# Patient Record
Sex: Female | Born: 1935 | Race: White | Hispanic: No | Smoking: Never smoker
Health system: Southern US, Community
[De-identification: ages and names within clinical notes are randomized; demographics above are authoritative.]

## PROBLEM LIST (undated history)

## (undated) DIAGNOSIS — E559 Vitamin D deficiency, unspecified: Secondary | ICD-10-CM

## (undated) DIAGNOSIS — K449 Diaphragmatic hernia without obstruction or gangrene: Secondary | ICD-10-CM

## (undated) DIAGNOSIS — Z86711 Personal history of pulmonary embolism: Secondary | ICD-10-CM

## (undated) DIAGNOSIS — R269 Unspecified abnormalities of gait and mobility: Secondary | ICD-10-CM

## (undated) DIAGNOSIS — F0280 Dementia in other diseases classified elsewhere without behavioral disturbance: Secondary | ICD-10-CM

## (undated) DIAGNOSIS — F0390 Unspecified dementia without behavioral disturbance: Secondary | ICD-10-CM

## (undated) DIAGNOSIS — G309 Alzheimer's disease, unspecified: Secondary | ICD-10-CM

## (undated) DIAGNOSIS — E785 Hyperlipidemia, unspecified: Secondary | ICD-10-CM

## (undated) DIAGNOSIS — S82892A Other fracture of left lower leg, initial encounter for closed fracture: Secondary | ICD-10-CM

## (undated) HISTORY — PX: HIATAL HERNIA REPAIR: SHX195

## (undated) HISTORY — DX: Personal history of pulmonary embolism: Z86.711

## (undated) HISTORY — DX: Hyperlipidemia, unspecified: E78.5

## (undated) HISTORY — PX: CATARACT EXTRACTION: SUR2

## (undated) HISTORY — DX: Diaphragmatic hernia without obstruction or gangrene: K44.9

## (undated) HISTORY — PX: ANKLE FRACTURE SURGERY: SHX122

## (undated) HISTORY — DX: Unspecified abnormalities of gait and mobility: R26.9

## (undated) HISTORY — DX: Vitamin D deficiency, unspecified: E55.9

## (undated) HISTORY — DX: Other fracture of left lower leg, initial encounter for closed fracture: S82.892A

## (undated) HISTORY — DX: Alzheimer's disease, unspecified: G30.9

## (undated) HISTORY — DX: Dementia in other diseases classified elsewhere without behavioral disturbance: F02.80

---

## 1998-06-25 ENCOUNTER — Other Ambulatory Visit: Admission: RE | Admit: 1998-06-25 | Discharge: 1998-06-25 | Payer: Self-pay | Admitting: Obstetrics and Gynecology

## 1998-06-26 ENCOUNTER — Other Ambulatory Visit: Admission: RE | Admit: 1998-06-26 | Discharge: 1998-06-26 | Payer: Self-pay | Admitting: Obstetrics and Gynecology

## 1999-02-18 ENCOUNTER — Ambulatory Visit (HOSPITAL_COMMUNITY): Admission: RE | Admit: 1999-02-18 | Discharge: 1999-02-18 | Payer: Self-pay | Admitting: Gastroenterology

## 1999-02-18 ENCOUNTER — Encounter (INDEPENDENT_AMBULATORY_CARE_PROVIDER_SITE_OTHER): Payer: Self-pay | Admitting: *Deleted

## 1999-09-14 ENCOUNTER — Other Ambulatory Visit: Admission: RE | Admit: 1999-09-14 | Discharge: 1999-09-14 | Payer: Self-pay | Admitting: Obstetrics and Gynecology

## 2000-01-20 ENCOUNTER — Encounter (INDEPENDENT_AMBULATORY_CARE_PROVIDER_SITE_OTHER): Payer: Self-pay | Admitting: Specialist

## 2000-01-20 ENCOUNTER — Ambulatory Visit (HOSPITAL_COMMUNITY): Admission: RE | Admit: 2000-01-20 | Discharge: 2000-01-20 | Payer: Self-pay | Admitting: Gastroenterology

## 2000-11-19 ENCOUNTER — Emergency Department (HOSPITAL_COMMUNITY): Admission: EM | Admit: 2000-11-19 | Discharge: 2000-11-19 | Payer: Self-pay | Admitting: Emergency Medicine

## 2001-12-06 ENCOUNTER — Other Ambulatory Visit: Admission: RE | Admit: 2001-12-06 | Discharge: 2001-12-06 | Payer: Self-pay | Admitting: Obstetrics and Gynecology

## 2004-09-29 ENCOUNTER — Other Ambulatory Visit: Admission: RE | Admit: 2004-09-29 | Discharge: 2004-09-29 | Payer: Self-pay | Admitting: Obstetrics and Gynecology

## 2005-06-11 ENCOUNTER — Encounter: Admission: RE | Admit: 2005-06-11 | Discharge: 2005-06-11 | Payer: Self-pay | Admitting: Gastroenterology

## 2005-09-06 ENCOUNTER — Inpatient Hospital Stay (HOSPITAL_COMMUNITY): Admission: EM | Admit: 2005-09-06 | Discharge: 2005-09-10 | Payer: Self-pay | Admitting: Emergency Medicine

## 2006-05-19 ENCOUNTER — Ambulatory Visit (HOSPITAL_COMMUNITY): Admission: RE | Admit: 2006-05-19 | Discharge: 2006-05-19 | Payer: Self-pay | Admitting: Endocrinology

## 2007-04-07 ENCOUNTER — Encounter: Admission: RE | Admit: 2007-04-07 | Discharge: 2007-04-07 | Payer: Self-pay | Admitting: Gastroenterology

## 2007-04-27 ENCOUNTER — Ambulatory Visit (HOSPITAL_COMMUNITY): Admission: RE | Admit: 2007-04-27 | Discharge: 2007-04-27 | Payer: Self-pay | Admitting: Gastroenterology

## 2007-05-05 ENCOUNTER — Encounter: Admission: RE | Admit: 2007-05-05 | Discharge: 2007-05-05 | Payer: Self-pay | Admitting: Surgery

## 2007-06-13 ENCOUNTER — Ambulatory Visit: Admission: RE | Admit: 2007-06-13 | Discharge: 2007-06-13 | Payer: Self-pay | Admitting: Surgery

## 2007-09-20 ENCOUNTER — Ambulatory Visit (HOSPITAL_COMMUNITY): Admission: RE | Admit: 2007-09-20 | Discharge: 2007-09-22 | Payer: Self-pay | Admitting: Surgery

## 2008-12-21 ENCOUNTER — Emergency Department (HOSPITAL_COMMUNITY): Admission: EM | Admit: 2008-12-21 | Discharge: 2008-12-21 | Payer: Self-pay | Admitting: Emergency Medicine

## 2009-04-29 ENCOUNTER — Encounter (INDEPENDENT_AMBULATORY_CARE_PROVIDER_SITE_OTHER): Payer: Self-pay | Admitting: Specialist

## 2009-04-29 ENCOUNTER — Ambulatory Visit (HOSPITAL_COMMUNITY): Admission: RE | Admit: 2009-04-29 | Discharge: 2009-04-29 | Payer: Self-pay | Admitting: Specialist

## 2009-04-29 ENCOUNTER — Ambulatory Visit: Payer: Self-pay | Admitting: Vascular Surgery

## 2010-06-18 LAB — POCT CARDIAC MARKERS
CKMB, poc: 2.3 ng/mL (ref 1.0–8.0)
Troponin i, poc: <0.05 ng/mL (ref 0.00–0.09)

## 2010-06-18 LAB — URINALYSIS, ROUTINE W REFLEX MICROSCOPIC
Hgb urine dipstick: NEGATIVE
Ketones, ur: NEGATIVE mg/dL
Protein, ur: 30 mg/dL — AB
Urobilinogen, UA: 1 mg/dL (ref 0.0–1.0)

## 2010-06-18 LAB — URINE MICROSCOPIC-ADD ON

## 2010-06-18 LAB — DIFFERENTIAL
Eosinophils Relative: 1 % (ref 0–5)
Monocytes Absolute: 0.4 10*3/uL (ref 0.1–1.0)

## 2010-06-18 LAB — COMPREHENSIVE METABOLIC PANEL
AST: 41 U/L — ABNORMAL HIGH (ref 0–37)
Albumin: 3.1 g/dL — ABNORMAL LOW (ref 3.5–5.2)
Alkaline Phosphatase: 50 U/L (ref 39–117)
CO2: 28 mEq/L (ref 19–32)
Chloride: 100 mEq/L (ref 96–112)
Creatinine, Ser: 0.93 mg/dL (ref 0.4–1.2)
Sodium: 136 mEq/L (ref 135–145)
Total Bilirubin: 0.9 mg/dL (ref 0.3–1.2)
Total Protein: 5.9 g/dL — ABNORMAL LOW (ref 6.0–8.3)

## 2010-06-18 LAB — URINE CULTURE

## 2010-06-18 LAB — CBC
HCT: 38.1 % (ref 36.0–46.0)
Hemoglobin: 13 g/dL (ref 12.0–15.0)
Platelets: 138 10*3/uL — ABNORMAL LOW (ref 150–400)
RBC: 4.02 MIL/uL (ref 3.87–5.11)
WBC: 6 10*3/uL (ref 4.0–10.5)

## 2010-07-28 NOTE — Op Note (Signed)
Kathy [name], Kathy [name]:  [id number]   MEDICAL RECORD NO.:  [id number]          PATIENT TYPE:  INP   LOCATION:  0098                         FACILITY:  [facility]   PHYSICIAN:  Ardeth Sportsman, MD     DATE OF BIRTH:  May 16, 1922   DATE OF PROCEDURE:  DATE OF DISCHARGE:                               OPERATIVE REPORT   PRIMARY CARE PHYSICIAN:  Veverly Fells. Altheimer, M.D..   GASTROENTEROLOGIST:  Dr. Matthias Hughs with [facility] gastroenterology.   PREOPERATIVE DIAGNOSES:  1. Achalasia with progressive dysphagia.  2. Eosinophilic esophagitis.   POSTOPERATIVE DIAGNOSES:  1. Achalasia with progressive dysphagia.  2. Eosinophilic esophagitis.  3. Large paraesophageal hernia.   PROCEDURE PERFORMED:  1. Laparoscopic paraesophageal hernia repair.  2. Type 2 mediastinal dissection.  3. Laparoscopic Heller esophagocardiomyotomy.  4. Laparoscopic toupee (partial posterior 240 degrees) fundoplication.  5. Repair of mucosal tear at esophagogastric junction.  6. Oversew of scald burn to greater curvature of stomach x1.   SURGEON:  Ardeth Sportsman, MD.   ASSISTANCE:  Harden Mo.   ANESTHESIA:  1. General anesthesia.  2. Local anesthetic with field block at all port sites.   DRAINS:  A 15-French Blake drain goes into the anterior mediastinum and  runs over the myotomy and out a right upper quadrant drain and right  upper quadrant port site.   ESTIMATED BLOOD LOSS:  50 mL.   COMPLICATIONS:  None major.   INDICATIONS:  Ms. Milian is an 77 year old female with progressive  dysphagia with manometric findings and radiographic findings concerning  for achalasia.  She had underwent endoscopy by Dr. Matthias Hughs and was felt  to have probable eosinophilic esophagitis.  She was given a trial of  orally ingested aerosolized steroids and did not have improvement.  He  did endoscopy and did not find a stricture or pseudoachalasia.  He was  concerned of a possible hiatal hernia as well.   Given her failure of maximal medical therapy, recommendations made for  expert diagnostic laparoscopy with Heller esophagocardiomyotomy and  partial fundoplication.  The risks, benefits and alternatives were  discussed, questions answered, and she agreed to proceed.   OPERATIVE FINDINGS:  She had moderate-sized paraesophageal hernia with  about a third of her stomach herniated up into her chest.  The hiatal  defect was about 8 x 14 cm in size.  She did a moderately dilated  esophagus with achalasia.  She definitely had hypertrophic fibers at her  esophagogastric junction, as well as about the distal half of her  esophagus.   DESCRIPTION OF PROCEDURE:  Informed consent was confirmed.  The patient  received IV cefoxitin just prior to surgery.  She had sequential  compression devices active during the entire case.  She was positioned  supine on a split leg table with arms tucked.  She underwent general  anesthesia without difficulty.  She had a Foley catheter sterilely  placed.  Her abdomen was clipped, prepped and draped in a sterile  fashion.   A 5 mm port was placed in the left upper quadrant in the paramedian  region using  optical entry technique with the patient in steep reversed  Trendelenburg and left-side up.  Camera inspection revealed no intra-  abdominal injury.  Under direct visualization, 5 mm ports were placed in  the right midabdomen and the right subxiphoid region.  Another 5 mm port  was placed on the left subxiphoid region, removed and a Nathanson omega  shaped rigid liver retractor was used to lift the left lateral sector of  the liver anteriorly.  A 10mm port was placed along the left subcostal  ridge in the midclavicular line.   Camera inspection again revealed no intra-abdominal injury.  There was  an obvious paraesophageal hernia.  I decided to repair this.  At the  apex between the right and left crura, we pulled down the anterior  mediastinal hernia sac back  into the abdomen.  I used the Harmonic  scalpel to help cut into the sac.  Using blunt and ultrasonic  dissection, I was able to free the hernia sac off the anterior right and  left crura.  I was able to peel the hernia sac off the mediastinum and  back into the abdomen.  This helped reduce the stomach and its contents  into the abdomen.  Further dissection was done on the right crus down to  the base until I could find the base of the right crus.  I was able to  elevate the esophagus and stomach anteriorly to help define the  posteromedial and posterolateral aspects of the left crus.   The stomach was rolled over towards the liver and the short gastrics  were ligated using ultrasonic dissection about a third of the way from  the greater curvature of the stomach more proximally on the cardia.  I  eventually met up with the left crus and helped complete its release  of  the phrenoesophageal attachments on left side of the crus.   A type 2 mediastinal dissection was meticulously done to help get as  much intra-abdominal esophageal length as possible.  The esophagus  became more dilated in the mid esophagus.  I was able free off the  pleura and I did have small branches in the pleura on both sides, but  they were wide open without significant bleeding.  Her esophagus was  more adherent anteriorly and it was carefully freed off using mostly  blunt, as well as focused ultrasonic dissection until it came to the  azygos vein and stopped.  With this, I was able to get about 4 cm intra-  abdominal esophageal length.   Attention was turned toward the myotomy.  I identified the anterior  vagus nerve and was able to mobilize it off towards the right.  The  anterior mediastinal fat pad was freed off using ultrasonic dissection,  taking care to avoid injury to the anterior hernia sac.  The anterior  mediastinal hernia sac was freed off the esophagogastric junction in the  esophagus to better define  esophagogastric junction.  A myotomy was  started at the esophagogastric junction using initially scissors and  then using careful [place] to help spread through underneath the  circular fibers and help elevate it.  When it was elevated, I used the  Harmonic scalpel to help ligate the elevated circular muscle fibers.  Gradually, I was able to march up the esophagus about 9 cm until I  finally came to less hypertrophic circular muscle and became more  normal.  There were a few branching vessels where most of the bleeding  occurred, but these were able to be isolated and controlled with focus  cautery staying away from mucosa.   Further dissection was done to go down the esophagogastric junction down  onto the stomach.  In freeing that down, I did get a 3 mm cut into  mucosa anteriorly at the esophagogastric junction.  This was sewn shut  using a 4-0 PDS figure-of-eight stitch to good result.  Further  meticulous dissection was done to go 4 cm onto the stomach anteriorly  until it was nice and clear.  Mucosa was obviously freed.  Hemostasis  was assured.   The cardia of the stomach was brought behind the esophagus over to the  right side to set the right side of the wrap, and the remaining left  side of the proximal body of the stomach was grasped and elevated  cephalad and a classic shoeshine maneuver was done to help mobilize and  to make sure that the posterior wrap was no twisted or turned.  It  seemed like I had adequate mobilization of the stomach.  The wrap was  brought back over to the right side and the stomach was pulled over  towards the spleen to better expose the esophageal hiatus.   Esophageal hiatus was closed using #1 double-armed horizontal mattress 0  Ethibond stitches with pledgets on each side.  This was done x 3 to help  tighten down the esophageal hiatus to a more normal size posteriorly.  An 6 x 12 cm thick flex HD biologic mesh was opened up and cut to a  broad  U-shape.  It was blood in rough side onto the diaphragm and holes  had been punched in using a 3 mm biopsy punch biopsy.  The mesh was  secured to crural closure using #1 Ethibond stitches x2, going through  the mesh, through the close crura and out the mesh again and tied down  to good result.  The superomedial, superolateral and posterolateral  corners of the mesh were secured with three interrupted stitches on each  side, taking bites of healthy diaphragm anteriorly, as well as taking  shallow, but long bites of retroperitoneal fascia on both sides, taking  care to avoid injury to the IVC or splenic vessels.  Once this was tied  down, the mesh laid well and flat.   Again, the cardia was brought around the esophagus to set up the right  side wrap and a shoeshine maneuver again was done.  A posterior  gastropexy was done taking the #1 Ethibond stitches that were used to  secure the middle posterior part of the mesh to the posterior wrap using  interrupted stitches x2 to good result.  This helped provide a good  posterior gastropexy x2.   Next, the most superior aspect of the greater curvature of the stomach  on the left side was secured to the anterior crus in the anterior corner  of the esophageal mesh, as well as to the lateral side of the myotomy of  the esophagus and tied down to help hold the myotomy open.  A similar  stitch was placed in a mirror image fashion on the right side, taking a  bite of the lower edge of the right side of the esophageal myotomy into  the anterior crus on the right side a bite of the mesh and a bite of the  most superior aspect of the right side of the wrap and tied down.  This  helped to wrap well  for completion.  Two more pairs of interrupted  stitches were placed in a mirror image fashion, going more distally on  the esophagus and the final stitch was more on the stomach or more at  the esophagogastric junction.  This total length was about 4 cm.  It   helped hold the myotomy well.   Because there had been a tear in the mucosa, I went ahead and some  Tisseel and placed that gently to help cover up around the area of the  stitch repair where 4-0 PDS was at the esophagogastric junction.  I put  about 5 mL over that region, soft patch and then put a little more  around the flex HD mesh to help encourage it to secure as well around  its edges.   Meticulous inspection was made and hemostasis was assured.  Copious  irrigation was performed.  A 15-French Blake drain was placed with its  tip going anteriorly at the apex between the crura, anterior to the  esophagus and the anteromedial mediastinum.  The drain was straight down  over the esophageal myotomy and brought out through the RUQ port as  already explained.  The [facility] liver retractor was removed under  direct visualization.  Capnoperitoneum was  evacuated.  Ports were  removed.  The skin was meticulously closed using 4-0 Monocryl stitch.  The drain was secured using 2-0 nylon stitch.  Sterile dressings  applied.   I discussed postoperative care with the patient just prior to surgery  and about to explain to her family just after surgery.      Ardeth Sportsman, MD  Electronically Signed     SCG/MEDQ  D:  09/20/2007  T:  09/20/2007  Job:  [id number]   cc:   Bernette Redbird, M.D.  Fax: [phone]   Veverly Fells. Altheimer, M.D.  Fax: [phone]

## 2010-07-28 NOTE — Op Note (Signed)
Kathy [name], SMYRE               ACCOUNT NO.:  [id number]   MEDICAL RECORD NO.:  [id number]          PATIENT TYPE:  AMB   LOCATION:  ENDO                         FACILITY:  [facility]   PHYSICIAN:  Bernette Redbird, M.D.   DATE OF BIRTH:  11-17-1922   DATE OF PROCEDURE:  04/27/2007  DATE OF DISCHARGE:                               OPERATIVE REPORT   PROCEDURE:  Esophageal manometry.   INDICATIONS:  This elderly female, age 77, has significant dysphagia  symptoms and weight loss with a barium swallow showing fairly classic  findings of achalasia including a dilated esophagus, poor transit of  barium, and a birds beak at the esophagogastric junction.  She has is  being considered for possible Heller myotomy.   FINDINGS:  The esophageal body findings very compatible with achalasia.   PROCEDURE IN DETAIL:  The procedure had been discussed with the  patient's daughter since the patient, herself, has some degree of  organic brain syndrome.  She came as an outpatient to [facility] and  the solid state manometry catheter was passed transnasally into the  esophagus, but never successfully entered the stomach or traversed the  lower esophageal sphincter, not surprising considering the very  massively dilated esophagus.   FINDINGS:  1. Lower esophageal sphincter not studied in this exam, unable to be      traversed with the manometry probe.  2. Esophageal body. Amplitudes were all abnormally low, on the order      of 7-12 mmHg.  None of the wet swallows resulted in peristaltic      contractions.  Mostly, the patient had simultaneous contractions      and she had one retrograde contraction.  The duration of      contractions, however, was normal.  3. Upper esophageal sphincter study.  Resting tone and percent      relaxation of the UES were normal and it appeared to have      coordinated relaxation with pharyngeal contractions.   IMPRESSION:  Abnormal study, consistent with achalasia,  characterized by  low amplitude, aperistaltic esophageal body contractions.  LES study not  done this exam.   PLAN:  Await surgical referral.           ______________________________  Bernette Redbird, M.D.     RB/MEDQ  D:  04/27/2007  T:  04/28/2007  Job:  [id number]   cc:   Ardeth Sportsman, MD  [address] [place] [address]

## 2010-07-28 NOTE — Discharge Summary (Signed)
Kathy [name], Kathy [name]               ACCOUNT NO.:  [id number]   MEDICAL RECORD NO.:  [id number]          PATIENT TYPE:  INP   LOCATION:  1540                         FACILITY:  [facility]   PHYSICIAN:  Ardeth Sportsman, MD     DATE OF BIRTH:  Mar 02, 1923   DATE OF ADMISSION:  09/20/2007  DATE OF DISCHARGE:  09/22/2007                               DISCHARGE SUMMARY   PRIMARY CARE PHYSICIAN:  Veverly Fells. Altheimer, M.D.   GASTROENTEROLOGIST:  Bernette Redbird, M.D.   PRIMARY FINAL DISCHARGE DIAGNOSES:  1. Large paraesophageal hernia.  2. Achalasia.   PRINCIPAL PROCEDURES PERFORMED:  1. Laparoscopic paraesophageal hernia repair with mesh.  2. Laparoscopic Heller esophagocardiomyotomy.  3. Laparoscopic Toupet (partial posterior 240 degrees) fundoplication      on September 20, 2007.   HOSPITAL COURSE:  Kathy [name] is a pleasant 77 year old female who has  had worsening dysphagia and endoscopic EGD findings concerning for  achalasia.  She had biopsy of eosinophilic esophagitis but did not have  improvement on an oral steroid regimen.  Therefore, she underwent  surgery as noted above.  Postoperatively, she had adequate pain control  and good urine output.  Hemoglobin remained stable.  She had a swallow  evaluation on postoperative day #1.  Her wrap was a little bit tight  still but fluid did get down into the stomach.  Her esophagus remains  dilated, not surprisingly.  She was started on a liquid diet and was  tolerating a pureed diet by postoperative day #2.  She was walking the  hallways.  She had adequate pain control on oral medications.  Her sats  were a little on the low side but remained in the low 90s on room air  and she has a history of lung issues in the past.  Based on these  improvements, I feel it would be reasonable for her to be discharged  home with the following instructions.   DISCHARGE INSTRUCTIONS:  1. She is to return to clinic to see me in about 2 weeks.  2. She should stick  to a blenderized-type diet and advance to a soft      diet as tolerated.  I gave a 5-page esophageal surgery advancement      hand-out as needed.  3. She should call if she has any fever, chills, sweats, nausea or      vomiting, worsening pain, drainage from incisions or other      concerns.  4. She should take Lortab elixir 7.5-15 mg p.o. q.4 h p.r.n. pain.      She can use ice packs or heating pads as needed for pain.  She can      take Tylenol instead for milder pain.  5. She should take Reglan 10 mg p.o. q.6 h or Phenergan 12.5      suppositories p.r. q.6 h p.r.n. nausea or vomiting.  She was      instructed that it will be difficult for her to vomit after a      paraesophageal hernia repair and therefore she should have those  available in case she has an episode of severe nausea later on in      life.  6. She should resume her home medications which include      a.     Aspirin 81 mg p.o. q.a.m.      b.     Evista 60 mg p.o. q.a.m.      c.     Aricept 10 mg p.o. q.a.m.      d.     Vitamin D3 50 every Friday.      e.     Synthroid 100 mcg q.a.m.      f.     Namenda memantine HCl 10 mg b.i.d.      g.     Vitamin B12 1 gram p.o. daily.      Ardeth Sportsman, MD  Electronically Signed     SCG/MEDQ  D:  09/22/2007  T:  09/22/2007  Job:  [id number]   cc:   Veverly Fells. Altheimer, M.D.  Fax: [phone]   Bernette Redbird, M.D.  Fax: [phone]

## 2010-07-31 NOTE — Procedures (Signed)
[place]. [facility]  Patient:    Kathy [name], Kathy [name]                        MRN: [id number] Proc. Date: 01/20/00 Adm. Date:  [id number] Attending:  Rich Brave CC:         Juluis Mire, M.D.   Procedure Report  PROCEDURE:  Upper endoscopy.  ENDOSCOPIST:  Florencia Reasons, M.D.  INDICATIONS:  A 77 year old female with transiently Hemoccult positive stool, without exposure to aspirin or NSAIDs.  FINDINGS:  Antral gastritis.  INFORMED CONSENT:  The nature, purpose and risk of the procedure have been discussed with the patient who provided written consent.  SEDATION:  Fentanyl 60 mcg and Versed 6 mg IV without arrhythmias or desaturation.  The Olympus video endoscope was passed under direct vision.  The vocal cords looked normal.  The esophagus was entered and was normal in its entirety without evidence of reflux esophagitis, Barretts esophagus, varices, infection, neoplasia, or any ring stricture or hiatal hernia.  The stomach was entered.  It contained no blood or coffee ground material. The antrum of the stomach had some linear erythematous minimally eroded patches radiating from the pylorus on the floor of the antrum.  No overt erosions or ulcers were seen.  Again, no polyps or masses were evident.  A reflexed view of the proximal stomach was unremarkable.  The pylorus, duodenal bulb and second duodenum looked normal.  The scope was removed from the patient.  She tolerated the procedure well and there were no apparent complications.  IMPRESSION:  Antral gastritis, etiology unclear.  This might account for the patients transient Hemocult positivity, particularly if it was more severe previously.  PLAN:  Proceed to colonoscopic evaluation. DD:  01/20/00 TD:  01/20/00 Job: [id number]

## 2010-07-31 NOTE — Procedures (Signed)
[place]. [facility]  Patient:    Kathy [name]                         MRN: [id number] Proc. Date: 02/18/99 Adm. Date:  [id number] Attending:  Rich Brave CC:         Juluis Mire, M.D.                           Procedure Report  PROCEDURE PERFORMED:  Colonoscopy with polypectomy.  ENDOSCOPIST:  Florencia Reasons, M.D.  INDICATIONS:  A 77 year old female with hemoccult positive stool.  FINDINGS:  Medium-sized sigmoid polyp removed by snare technique.  DESCRIPTION OF PROCEDURE:  The nature, purpose and risks of the procedure had been discussed with the patient, who provided written consent.  Sedation was fentanyl 50 mcg and Versed 5 mg IV without arrhythmias or desaturation.  The Olympus pediatric video colonoscope was advanced through a somewhat fixated and angulated sigmoid region around some hairpin turns to the cecum and for a short distance nto a normal-appearing terminal ileum, where upon pullback was initiated.  The quality of the prep was excellent and it is felt that all areas were well seen.  There was a 6 to 7 semipedunculated polyp in the sigmoid region probably at about 25 cm, removed by snare technique with complete hemostasis and no evidence of excessive cautery.  No other polyps were seen and there was no evidence of colon cancer, colitis or vascular malformations.  There was little if any diverticular disease.  Retroflexion in the rectum was unremarkable.  The patient tolerated the procedure well and there were no apparent complications.  IMPRESSION:  Sigmoid polyp.  Otherwise normal exam.  PLAN:  Await pathology on the polyp with consideration for follow-up colonoscopy in three years if it is adenomatous in character. DD:  02/18/99 TD:  02/19/99 Job: [id number]

## 2010-07-31 NOTE — Discharge Summary (Signed)
Kathy [name], [name]:  [id number]   MEDICAL RECORD NO.:  [id number]          PATIENT TYPE:  INP   LOCATION:  5013                         FACILITY:  [facility]   PHYSICIAN:  Erasmo Leventhal, M.D.DATE OF BIRTH:  Mar 27, 1922   DATE OF ADMISSION:  09/06/2005  DATE OF DISCHARGE:  09/10/2005                                 DISCHARGE SUMMARY   ADMISSION DIAGNOSIS:  Open fracture, distal tibia, right leg.   DISCHARGE DIAGNOSES:  1.  Open fracture, distal tibia, right leg.  2.  Postoperative anemia.   OPERATION:  Irrigation and debridement, I&D, with IM nail of right tibia.   BRIEF HISTORY:  This is an 77 year old lady with a history of a fall from a  ladder the day of admission while trimming bushes in her yard. She was  brought to the hospital by ambulance where she was noted to have an open  TIB/FIB fracture on the right. She had no other injuries. She subsequently  was taken to the operating room for irrigation and debridement and IM  nailing of her fracture.   LABORATORY VALUES:  Admission CBC showed a white count high at 11.4,  neutrophils at 88, lymphs at 8. Her white count then dropped to normal and  remained normal throughout admission. She reached a low on her hemoglobin  and hematocrit of 10.6 and 31.5 on the 28th. BMET remained within normal  limits through the admission with the exception of a slightly low potassium  on the 27th and her glucose very mildly elevated through admission. Counts  were also low, 7.8, on the 27th, but that was up to 8.1 on the 28th and  potassium corrected. PT/INR was normal when she came in, and at discharge,  INR was 1.9 on Coumadin protocol.   COURSE IN THE HOSPITAL:  The patient tolerated the operative procedure well.  The first postoperative day, she was feeling okay. She had minimal pain.  Vital signs stable, afebrile. Dressing was dry. Minimal swelling was noted.  Neurovascular status was intact in the toes, and  there were no compartment  signs. IV antibiotics were continued. Second postoperative day, vital signs  were stable. Neurovascular status was intact in the toes. Dressing was  changed. The drain was advanced. The wound was benign. Swelling and  ecchymosis were decreased. There was no necrotic tissue. Calves were  negative. IV antibiotics were continued. Third postoperative day, she  continued to do very well. Vital signs were stable. She was afebrile.  Neurovascular status remained intact in the foot. Hemoglobin and hematocrit  were 10.6 and 31.5. INR was therapeutic. PCA was discontinued. She was  switched to p.o. medications. On the fourth postoperative day, vital signs  were stable. Dressing was changed. Wound was benign. Drain was removed.  Calves were negative. She was placed into new, very bulky, well padded  dressing with a posterior fiberglass splint, and she was subsequently to be  transferred to [facility] for post-hospitalization care.   CONDITION ON DISCHARGE:  Improved.   DISCHARGE MEDICATIONS:  1.  Synthroid 88 mcg p.o. daily.  2.  Crestor 10 mg p.o. daily.  3.  Evista 60 mg p.o. daily.  4.  Os-Cal chewables 1 p.o. daily.  5.  Vitamin D 1 p.o. daily.  6.  Percocet 5/325 one q.4-6h. p.r.n. pain.  7.  Coumadin. Coumadin will need to be per pharmacy protocol to maintain INR      between 2 and 2.5 for a total of 3 weeks from the date of surgery. She      will need every other day PT/INRs as she has been very labile in her      response to the Coumadin.  8.  Robaxin 500 mg p.o. q.8h. p.r.n. spasm.  9.  Trinsicon 1 pill p.o. b.i.d. for anemia.  10. Peri-Colace 1 p.o. b.i.d. for constipation.  11. Keflex 500 mg p.o. t.i.d. for 5 days.   DISCHARGE INSTRUCTIONS:  She should be at bedrest. She can do bed to chair  transfers, nonweight bearing, on the right leg. She should wear her knee  immobilizer at all times, and her legs should be elevated on three pillows   at all times to keep the foot higher than the knee and the knee higher than  the hip. She needs to return to Dr. Thomasena Edis' office late Monday afternoon  for reevaluation of her wound. Please call the office today for appointment  on Monday afternoon.      Jaquelyn Bitter. Chabon, P.A.    ______________________________  Erasmo Leventhal, M.D.    SJC/MEDQ  D:  09/10/2005  T:  09/10/2005  Job:  [id number]

## 2010-07-31 NOTE — H&P (Signed)
NAMEJAELEE, Kathy [name]:  [id number]   MEDICAL RECORD NO.:  [id number]          PATIENT TYPE:  EMS   LOCATION:  MAJO                         FACILITY:  [facility]   PHYSICIAN:  Erasmo Leventhal, M.D.DATE OF BIRTH:  [birth year]   DATE OF ADMISSION:  09/06/2005  DATE OF DISCHARGE:                                HISTORY & PHYSICAL   CHIEF COMPLAINT:  Open fracture, right distal tibia-fibula.   HISTORY OF PRESENT ILLNESS:  This is an 77 year old lady with a history of a  fall from a ladder while trimming bushes this morning in her yard.  She was  brought to the hospital by ambulance, where she was noted to have an open  tibia-fibula fracture on the right, no other injuries were noted.  We were  subsequently called, came in and evaluated the patient, and she is now  scheduled for irrigation and debridement and either IM nailing or open  reduction and internal fixation of her fractures.  The surgery, risks,  benefits and aftercare were discussed with the patient and her family and  surgery to go ahead as scheduled.  Questions invited and answered.   PAST MEDICAL HISTORY:   DRUG ALLERGIES:  CODEINE with nausea and vomiting.   CURRENT MEDICATIONS:  1.  Synthroid 88 mcg one daily.  2.  Crestor 10 mg one daily.  3.  Evista daily.   PREVIOUS SURGERIES:  None.   SERIOUS MEDICAL ILLNESSES:  1.  Hypothyroidism.  2.  Hyperlipidemia.   FAMILY HISTORY:  Negative.   SOCIAL HISTORY:  The patient is retired.  She lives at home alone.  She does  not smoke and does not drink.   REVIEW OF SYSTEMS:  CENTRAL NERVOUS SYSTEM:  Negative for headache, blurred  vision or dizziness.  PULMONARY:  No shortness of breath, PND or orthopnea.  CARDIOVASCULAR:  No chest pain or palpitations.  GASTROINTESTINAL:  Negative  for ulcers or hepatitis.  GENITOURINARY:  Negative for urinary tract  difficulties.  MUSCULOSKELETAL:  Positive as in HPI.   PHYSICAL EXAMINATION:  VITAL SIGNS:  BP  146/82, respirations 18, pulse 72  and regular.  GENERAL APPEARANCE:  This is a well-developed, well-nourished lady in  moderate distress with her right ankle.  HEENT:  The head is normocephalic and atraumatic.  The pupils equal, round,  and reactive to light.  Throat without injection.  Ears show clear TMs  bilaterally with no fluid, and nose is clear with no fluid or blood.  NECK:  Supple without adenopathy.  Range of motion is full and pain-free.  Carotids 2+ without bruit.  CHEST:  Clear to auscultation, no rales or rhonchi, respirations 18.  No  pain to AP or lateral chest or rib compression.  CARDIAC:  Heart has s 2/6 systolic ejection murmur with no irregular beats.  ABDOMEN:  Soft with active bowel sounds, no masses or organomegaly.  No pain  to AP or lateral pelvic compression.  NEUROLOGIC:  The patient is alert and oriented to time, place and person.  Cranial nerves II-XII grossly intact.  EXTREMITIES:  Within normal limits with  the exception of the right distal  tibia and fibula, with deformity.  There is sensation and circulation intact  to the foot, and the foot is warm.   X-rays reveal a tibia-fibula fracture, displaced and slightly comminuted.  Chest x-ray shows no acute disease.   IMPRESSION:  Open tibia-fibula fracture of the right distal tibia-fibula.   PLAN:  Irrigation and debridement and open reduction and internal fixation  versus IM nailing.      Jaquelyn Bitter. Chabon, P.A.    ______________________________  Erasmo Leventhal, M.D.    SJC/MEDQ  D:  09/06/2005  T:  09/06/2005  Job:  [id number]

## 2010-07-31 NOTE — Procedures (Signed)
[place]  Patient:    Kathy [name], Kathy [name]                        MRN: [id number] Proc. Date: 01/20/00 Adm. Date:  [id number] Attending:  Rich Brave CC:         Juluis Mire, M.D.   Procedure Report  PROCEDURE:  Colonoscopy with biopsies.  INDICATION:  A 77 year old female who had a medium-sized adenoma removed from the sigmoid colon approximately 10 months ago, but who now presents with transiently Hemoccult-positive stool without exposure to ulcerogenic medications.  FINDINGS:  Diminutive polyp biopsied at 32 cm.  Sigmoid diverticulosis.  DESCRIPTION OF PROCEDURE:  The nature, purpose, and risks of the procedure were familiar to the patient from her prior examination, and she provided a written consent.  This procedure was performed immediately following her upper endoscopy, for which she had received fentanyl 60 mcg and Versed 6 mg IV, and no additional sedation was administered.  She actually desaturated slightly to 89% transiently, but this resolved after repositioning the patients jaw and especially after turning her back onto her left side.  The Olympus pediatric adjustable-tension video colonoscope was advanced fairly easily around the colon to the cecum, applying some external abdominal compression to control looping, and placing the patient into the supine position to reach the base of the cecum.  The terminal ileum was entered for a short distance and appeared normal.  Pullback was then performed.  The quality of the prep was excellent, and it is felt that all areas were well-seen.  There was a tiny sessile 3 mm polyp at 32 cm, removed by a couple of cold biopsies, but no other polyps were seen, and there was no evidence of cancer, colitis, or vascular malformation.  She did have sigmoid diverticulosis. Retroflexion in the rectum was unremarkable.  The patient tolerated this procedure well, and there were no  apparent complications.  IMPRESSION: 1. Diminutive sigmoid polyp. 2. Sigmoid diverticulosis. 3. No source of transient heme-positivity evident on this exam.  PLAN:  Consider a follow-up colonoscopy in five years if the patient remains in stable overall medical condition. DD:  01/20/00 TD:  01/20/00 Job: [id number]

## 2010-07-31 NOTE — Op Note (Signed)
NAMEFATE, GALANTI NO.:  [id number]   MEDICAL RECORD NO.:  [id number]          PATIENT TYPE:  INP   LOCATION:  2550                         FACILITY:  [facility]   PHYSICIAN:  Erasmo Leventhal, M.D.DATE OF BIRTH:  1922-11-18   DATE OF PROCEDURE:  09/06/2005  DATE OF DISCHARGE:                                 OPERATIVE REPORT   INDICATION:  I met the patient and evaluated her before surgery and I also  talked to her family members.  She had a right type I open distal tibia and  fibula diaphyseal fractures.  I explained to the patient's family the  difficult problems with the possibility of infection, skin breakdown,  delayed union, nonunion, malunion, implant failure, DVT, PE and etc, and  they wished to proceed.   PREOPERATIVE DIAGNOSIS:  Right type I open tibial and fibular diaphyseal  fractures.   POSTOPERATIVE DIAGNOSIS:  Right type I open tibial and fibular diaphyseal  fractures.   PROCEDURE:  Irrigation and debridement of right tibia, bone and soft tissue,  static intramedullary nailing of tibia, C-arm fluoroscopy.   SURGEON:  Erasmo Leventhal, M.D.   ASSISTANT:  Jaquelyn Bitter. Chabon, P.A.   ANESTHESIA:   ESTIMATED BLOOD LOSS:  Less than 200 mL.   DRAINS:  One Penrose.   COMPLICATIONS:  None.   TOURNIQUET TIME:  Thirty minutes at 300 mmHg.   DISPOSITION:  To PACU, stable.   OPERATIVE DETAILS:  The patient was counseled in the holding area.  The  patient's family was also discussed with and with the pros and cons of  surgery in surgical intervention, they wished to go to surgery.  She was  taken to the operating room and placed in a supine position.  It was noted  in the records from the emergency room that she had not received tetanus  toxoid; therefore, that will be given later.  She was given 2 g of Ancef  intravenously.  In the operating room, she was placed under general  anesthesia, placed on the [place] table, padded and  bumped.   The right lower extremity was elevated, prepped with DuraPrep scrub and  prepped with Betadine, draped in a sterile fashion.  Extremity was elevated  and tourniquet was inflated to 300 mmHg.   Attention was directed first to the fracture site, where she had type I  puncture wounds medially over the fracture site.  A longitudinal incision  was made and the skin was found to be very soft and friable and kind of  fragile and we were very cautious with it.  The fracture was opened and  utilizing meticulous irrigation and debridement of the soft tissue and bone,  this underwent a thorough irrigation and debridement.  Wounds were again  irrigated.   Attention was directed proximally.  A longitudinal incision was made through  the medial patellar tendon through the skin and subcutaneous tissue.  A  medial parapatellar arthrotomy was performed, going intra-articular, but  staying extrasynovial.  The patellar tendon was retracted out of the way.  The tibial tubercle was found to be extremely  soft and osteoporotic.  A  starter hole was made and confirmed with the C-arm.  The standard step-  reamer was then utilized.  A ball-tipped guidewire was passed down the canal  across the fracture site.  I flexed the knee as much as possible __________  the tibial tubercle.  Sequential reaming was done up to 12.5 mm.  At this  time we measured the necessary tibial rod and implanted an 11-mm-in-width  Ace titanium nail.  It was taken to the appropriate depth, confirmed with C-  arm.  Proximally, there was 1 nice interlocking screw from anterolateral to  posteromedial.  There was also another screw placed, but the bone quality  was found to be somewhat softened and adjacent to help to hold that in  place, a soft tissue washer was applied.  Following this, attention was  directed distal.  The fracture site was well-reduced.  We actually it had  nicely impacted.  Alignment was good as far as varus and  valgus and we had a  nice canal filled with the intramedullary nail.  Utilizing the standard  technique for fluoroscopy, the distal interlocking screw was placed from  medial to lateral through a small puncture wound.  We now checked the nail  from proximal to distal; we had satisfactory alignment of the fracture and  excellent placement of the nails with no complications or problems.  Wounds  were copiously irrigated.  The distal interlocking screw wound was closed  with a staple.  The surgical wound was closed over a Penrose drain where the  open area was and I had also excised to the most viable skin there; this was  done as loosely at possible, trying to not put any type of excessive  pressure on the skin.  Proximally, the wounds were again irrigated.  The  arthrotomy was closed with Vicryl, as was the retinaculum, the subcu closed  with Vicryl and skin closed with staples.  Sterile dressings were applied to  all areas, fluffy gauze for swelling, plaster splints distally and knee  immobilizer proximally.  We also noted that prior to reaming, the tourniquet  had been deflated at that time during the surgical procedure.  The patient  tolerated the procedure well with no complications or problems.  She is  doing well in the recovery room at the time of this dictation.  I will also  note that after reduction of her fracture, her posterior tibial pulse  increased became better versus before when it was not reduced  and postoperatively in the recovery room, she was nice and she had good  vascular examination.  The patient was doing well in the recovery room at  the time of this dictation.   To help with surgical technique, retraction, etc, throughout entire case,  Mr. Viviann Spare Chabon's assistance was needed.           ______________________________  Erasmo Leventhal, M.D.     RAC/MEDQ  D:  09/06/2005  T:  09/07/2005  Job:  [id number]

## 2010-12-07 LAB — URINALYSIS, ROUTINE W REFLEX MICROSCOPIC
Bilirubin Urine: NEGATIVE
Glucose, UA: NEGATIVE
Hgb urine dipstick: NEGATIVE
Nitrite: NEGATIVE
Specific Gravity, Urine: 1.013
pH: 6.5

## 2010-12-07 LAB — DIFFERENTIAL
Eosinophils Relative: 6 — ABNORMAL HIGH
Lymphocytes Relative: 33
Lymphs Abs: 1.7
Monocytes Relative: 6
Neutrophils Relative %: 55

## 2010-12-07 LAB — COMPREHENSIVE METABOLIC PANEL
ALT: 15
BUN: 16
CO2: 26
Calcium: 8.9
GFR calc Af Amer: >60
GFR calc non Af Amer: >60
Glucose, Bld: 86
Potassium: 3.6
Total Protein: 6.5

## 2010-12-07 LAB — CBC
Hemoglobin: 13.7
MCV: 95.2
RDW: 13.9

## 2010-12-10 LAB — DIFFERENTIAL
Basophils Absolute: 0
Basophils Relative: 1
Eosinophils Absolute: 0.2
Eosinophils Relative: 4
Monocytes Absolute: 0.3
Neutro Abs: 3.1

## 2010-12-10 LAB — COMPREHENSIVE METABOLIC PANEL
AST: 21
Albumin: 3.6
Alkaline Phosphatase: 47
BUN: 16
Chloride: 102
Potassium: 4.1
Total Bilirubin: 1

## 2010-12-10 LAB — CBC
HCT: 43.1
Platelets: 237
WBC: 5.4

## 2010-12-10 LAB — URINALYSIS, ROUTINE W REFLEX MICROSCOPIC
Glucose, UA: NEGATIVE
Hgb urine dipstick: NEGATIVE
Specific Gravity, Urine: 1.019
Urobilinogen, UA: 0.2
pH: 6.5

## 2010-12-10 LAB — AMYLASE: Amylase: 105

## 2010-12-10 LAB — POTASSIUM: Potassium: 4.3

## 2012-04-25 ENCOUNTER — Encounter (HOSPITAL_COMMUNITY): Payer: Self-pay | Admitting: Physical Medicine and Rehabilitation

## 2012-04-25 ENCOUNTER — Emergency Department (HOSPITAL_COMMUNITY): Payer: Medicare HMO

## 2012-04-25 ENCOUNTER — Emergency Department (HOSPITAL_COMMUNITY)
Admission: EM | Admit: 2012-04-25 | Discharge: 2012-04-25 | Disposition: A | Discharge status: Home / Self Care | Payer: Medicare HMO | Attending: Emergency Medicine | Admitting: Emergency Medicine

## 2012-04-25 DIAGNOSIS — W010XXA Fall on same level from slipping, tripping and stumbling without subsequent striking against object, initial encounter: Secondary | ICD-10-CM | POA: Insufficient documentation

## 2012-04-25 DIAGNOSIS — T07XXXA Unspecified multiple injuries, initial encounter: Secondary | ICD-10-CM

## 2012-04-25 DIAGNOSIS — IMO0002 Reserved for concepts with insufficient information to code with codable children: Secondary | ICD-10-CM | POA: Insufficient documentation

## 2012-04-25 DIAGNOSIS — F0280 Dementia in other diseases classified elsewhere without behavioral disturbance: Secondary | ICD-10-CM | POA: Insufficient documentation

## 2012-04-25 DIAGNOSIS — Z7982 Long term (current) use of aspirin: Secondary | ICD-10-CM | POA: Insufficient documentation

## 2012-04-25 DIAGNOSIS — Y9301 Activity, walking, marching and hiking: Secondary | ICD-10-CM | POA: Insufficient documentation

## 2012-04-25 DIAGNOSIS — Z79899 Other long term (current) drug therapy: Secondary | ICD-10-CM | POA: Insufficient documentation

## 2012-04-25 DIAGNOSIS — Y92009 Unspecified place in unspecified non-institutional (private) residence as the place of occurrence of the external cause: Secondary | ICD-10-CM | POA: Insufficient documentation

## 2012-04-25 DIAGNOSIS — W19XXXA Unspecified fall, initial encounter: Secondary | ICD-10-CM

## 2012-04-25 DIAGNOSIS — Z23 Encounter for immunization: Secondary | ICD-10-CM | POA: Insufficient documentation

## 2012-04-25 HISTORY — DX: Unspecified dementia, unspecified severity, without behavioral disturbance, psychotic disturbance, mood disturbance, and anxiety: F03.90

## 2012-04-25 MED ORDER — TETANUS-DIPHTH-ACELL PERTUSSIS 5-2.5-18.5 LF-MCG/0.5 IM SUSP
0.5000 mL | Freq: Once | INTRAMUSCULAR | Status: AC
  Administered 2012-04-25: 0.5 mL via INTRAMUSCULAR
  Filled 2012-04-25: qty 0.5

## 2012-04-25 NOTE — ED Provider Notes (Signed)
I have supervised the resident on the management of this patient and agree with the note above. I personally interviewed and examined the patient and my addendum is below.   Kathy [name] is a 79 y.o. female s/p mechanical fall and slid down to the floor. Hit the bed frame with her feet. + abrasions bilaterally. Able to ambulate. Xray showed no fractures. Tetanus updated. Stable for d/c.    Richardean Canal, MD 04/25/12 [phone]

## 2012-04-25 NOTE — ED Notes (Signed)
Family at bedside. 

## 2012-04-25 NOTE — ED Notes (Signed)
Patient transported to X-ray 

## 2012-04-25 NOTE — ED Notes (Signed)
[name] st's pt slipped and fell.  Pt has bil lower leg abrasions.  Denies any other pain or problems.  Pt st's she feels good.

## 2012-04-25 NOTE — ED Notes (Addendum)
Pt presents to department for evaluation of fall. Family found her lying on floor at home. Abrasions noted to bilateral lower legs. Did not strike head. Pt able to move all extremities without difficulty. 5/10 pain at the time. Pt has dementia, but can answer simple questions. Pt is alert and cooperative.

## 2012-04-25 NOTE — ED Notes (Signed)
Pt in x-ray at this time

## 2012-04-25 NOTE — ED Provider Notes (Signed)
History     CSN: [id number]  Arrival date & time 04/25/12  1325   First MD Initiated Contact with Patient 04/25/12 1521      Chief Complaint  Patient presents with  . Fall    (Consider location/radiation/quality/duration/timing/severity/associated sxs/prior treatment) HPI Comments: 79 y/o F h/o early dementia presents s/p fall. Mechanical fall. Slipped at home. Landed on blankets and pillows. Legs slid under bed. Abrasions to b/l shins. Ambulatory afterwards. No head injury. No LOC. No neck pain. Acting herself per family.  Patient is a 79 y.o. female presenting with fall.  Fall The accident occurred 1 to 2 hours ago. The fall occurred while walking. Impact surface: pillows and blankets. There was no blood loss. Pain location: denies pain currently. The patient is experiencing no pain. She was ambulatory at the scene. There was no entrapment after the fall. Pertinent negatives include no fever, no numbness, no abdominal pain, no nausea, no vomiting, no headaches, no loss of consciousness and no tingling. She has tried nothing for the symptoms.    Past Medical History  Diagnosis Date  . Dementia     No past surgical history on file.  No family history on file.  History  Substance Use Topics  . Smoking status: Never Smoker   . Smokeless tobacco: Not on file  . Alcohol Use: No    OB History   Grav Para Term Preterm Abortions TAB SAB Ect Mult Living                  Review of Systems  Constitutional: Negative for fever and chills.  HENT: Negative for neck pain and neck stiffness.   Respiratory: Negative for cough and shortness of breath.   Cardiovascular: Negative for chest pain and leg swelling.  Gastrointestinal: Negative for nausea, vomiting, abdominal pain and diarrhea.  Genitourinary: Negative for dysuria, flank pain and difficulty urinating.  Musculoskeletal: Negative for back pain.  Skin: Positive for wound (b/l shin abrasions). Negative for rash.   Neurological: Negative for dizziness, tingling, loss of consciousness, numbness and headaches.  All other systems reviewed and are negative.    Allergies  Codeine  Home Medications   Current Outpatient Rx  Name  Route  Sig  Dispense  Refill  . aspirin EC 81 MG tablet   Oral   Take 81 mg by mouth daily.         . Cyanocobalamin (VITAMIN B 12 PO)   Oral   Take 1 tablet by mouth daily.         Marland Kitchen donepezil (ARICEPT) 23 MG TABS tablet   Oral   Take 23 mg by mouth every morning.         Marland Kitchen levothyroxine (SYNTHROID, LEVOTHROID) 100 MCG tablet   Oral   Take 100 mcg by mouth daily.         . memantine (NAMENDA) 10 MG tablet   Oral   Take 10 mg by mouth 2 (two) times daily.         . rosuvastatin (CRESTOR) 10 MG tablet   Oral   Take 10 mg by mouth every morning.           BP 113/60  Pulse 86  Temp(Src) 97.7 F (36.5 C) (Oral)  SpO2 94%  Physical Exam  Nursing note and vitals reviewed. Constitutional: She appears well-developed and well-nourished. No distress.  HENT:  Head: Normocephalic and atraumatic.  Eyes: Conjunctivae and EOM are normal. Pupils are equal, round, and reactive to light.  Right eye exhibits no discharge. Left eye exhibits no discharge.  Neck: Normal range of motion. Neck supple. No tracheal deviation present.  Cardiovascular: Normal rate, regular rhythm, normal heart sounds and intact distal pulses.   Pulmonary/Chest: Effort normal and breath sounds normal. No stridor. No respiratory distress. She has no wheezes. She has no rales.  Abdominal: Soft. She exhibits no distension. There is no tenderness. There is no guarding.  Musculoskeletal: She exhibits tenderness (minimal TTP over abrasions to b/l shins. NV intact distally). She exhibits no edema.  Neurological: She is alert. She has normal strength. No cranial nerve deficit or sensory deficit. Coordination normal. GCS eye subscore is 4. GCS verbal subscore is 5. GCS motor subscore is 6.   Skin: Skin is warm and dry.    ED Course  Procedures (including critical care time)  Labs Reviewed - No data to display Dg Pelvis 1-2 Views  04/25/2012  *RADIOLOGY REPORT*  Clinical Data: Larey Seat, abrasions, no reported pain.  PELVIS - 1-2 VIEW  Comparison:  None.  Findings:  There is no evidence of pelvic fracture or diastasis. No other pelvic bone lesions are seen. Within limits of detection on a one-view pelvis, no definite hip fracture.  If there is concern for a fracture however, hip series is recommended. There is moderate degenerative change in the visualized lumbar spine.  IMPRESSION: Unremarkable one-view pelvis.  See comments above.   Original Report Authenticated By: Davonna Belling, M.D.    Dg Tibia/fibula Left  04/25/2012  *RADIOLOGY REPORT*  Clinical Data: Larey Seat, abrasions.  LEFT TIBIA AND FIBULA - 2 VIEW  Comparison:  None.  Findings: There is no evidence of fracture or other focal bone lesions.  Soft tissues are unremarkable.  IMPRESSION: Negative.   Original Report Authenticated By: Davonna Belling, M.D.    Dg Tibia/fibula Right  04/25/2012  *RADIOLOGY REPORT*  Clinical Data: Larey Seat, abrasions  RIGHT TIBIA AND FIBULA - 2 VIEW  Comparison: Prior plain films from 2007.  Findings: Patient is status post placement of an intramedullary rod across a distal tibial fracture.  Fracture has healed.  There is also a fracture of the fibula at the junction of the middle and distal thirds which has healed in a position of slight angulation. No acute fractures seen. Hardware is intact.  IMPRESSION: No acute fracture.  Chronic changes as described.   Original Report Authenticated By: Davonna Belling, M.D.      No diagnosis found.    MDM    79 y/o F presents s/p mechanical fall. No head injury. Atraumatic on exam except for leg abrasions.  HDS, af. NAD. Smiling. Interactive. Denies any pain. No syncope. No chest pain. Ambulatory after event. Imaging without evidence of fracture. Remains NV  intact. Ambulatory with nurse. Baseline per family.  Patient discharged home. Return precautions given. To follow up with pcp prn. family in agreement with plan.  Labs and imaging reviewed by myself and considered in medical decision making if ordered. Imaging interpreted by radiology.   Discussed case with Dr. Silverio Lay who is in agreement with assessment and plan.           Stevie Kern, MD 04/25/12 1743

## 2012-04-25 NOTE — ED Notes (Signed)
Ambulate pt.up the hallway.with one assit.pt.

## 2012-04-26 NOTE — ED Provider Notes (Signed)
I have supervised the resident on the management of this patient and agree with the note above. I personally interviewed and examined the patient and my addendum is below.   See my separate note   Richardean Canal, MD 04/26/12 2221

## 2012-06-16 ENCOUNTER — Ambulatory Visit (INDEPENDENT_AMBULATORY_CARE_PROVIDER_SITE_OTHER): Payer: Medicare HMO | Admitting: Neurology

## 2012-06-16 ENCOUNTER — Encounter: Payer: Self-pay | Admitting: Neurology

## 2012-06-16 VITALS — BP 133/68 | HR 67 | Ht 61.0 in | Wt 127.0 lb

## 2012-06-16 DIAGNOSIS — G309 Alzheimer's disease, unspecified: Secondary | ICD-10-CM

## 2012-06-16 DIAGNOSIS — F0280 Dementia in other diseases classified elsewhere without behavioral disturbance: Secondary | ICD-10-CM

## 2012-06-16 DIAGNOSIS — R413 Other amnesia: Secondary | ICD-10-CM

## 2012-06-16 HISTORY — DX: Dementia in other diseases classified elsewhere, unspecified severity, without behavioral disturbance, psychotic disturbance, mood disturbance, and anxiety: F02.80

## 2012-06-16 MED ORDER — DONEPEZIL HCL 10 MG PO TABS: 10.0000 mg | ORAL_TABLET | Freq: Every day | ORAL | Status: DC

## 2012-06-16 MED ORDER — MEMANTINE HCL 10 MG PO TABS: 10.0000 mg | ORAL_TABLET | Freq: Two times a day (BID) | ORAL | Status: DC

## 2012-06-16 NOTE — Progress Notes (Signed)
Reason for visit: Alzheimer's disease  Kathy [name] is an 79 y.o. female  History of present illness:  Kathy [name] is an 79 year old left-handed white female with a history of Alzheimer's disease. Kathy [name] was seen approximately one year ago, and she has progressed significantly with her mental status. Kathy [name] currently has her daughter living with her within Kathy last 6 weeks as there were safety concerns. Kathy [name] was wandering outside of Kathy house. Kathy [name] requires supervision with taking her medications, and with bathing and dressing. Kathy [name] will be up and down throughout Kathy night until about 4 AM, and she will sleep until about 9 AM. Kathy [name] now has a daughter who lives with her at all times, and her other daughter is Kathy POA. Kathy [name] has difficulty remembering who her family members are, and she oftentimes believes that her daughter is a "hired help person". Kathy [name] does not always recall that her own house is hers. Kathy [name] may wake up crying at night. Kathy [name] oftentimes misplaces things about Kathy house. Kathy [name] will sometimes have difficulty with bowel or bladder control. Kathy [name] will begin to become disoriented around 5 PM, and this worsens throughout Kathy evening. Kathy [name] has not had any agitation problems. Kathy [name] remains on Aricept and Namenda, and she is tolerating these medications well. Kathy [name] has had some issues with balance, and she has fallen within Kathy last several weeks.  Past Medical History  Diagnosis Date  . Dementia   . Dyslipidemia   . Vitamin D deficiency   . Alzheimer's disease 06/16/2012  . Ankle fracture, left     As a child  . Hiatal hernia     Past Surgical History  Procedure Laterality Date  . Ankle fracture surgery Left   . Hiatal hernia repair N/A   . Cataract extraction Left     Family History  Problem Relation Age of Onset  . Heart disease Mother   . Cancer Father   . Heart disease Sister      Social history:  reports that she has never smoked. She does not have any smokeless tobacco history on file. She reports that she does not drink alcohol or use illicit drugs.  Allergies:  Allergies  Allergen Reactions  . Codeine Other (See Comments)    Unknown     Medications:  Current Outpatient Prescriptions on File Prior to Visit  Medication Sig Dispense Refill  . aspirin EC 81 MG tablet Take 81 mg by mouth daily.      Marland Kitchen levothyroxine (SYNTHROID, LEVOTHROID) 100 MCG tablet Take 100 mcg by mouth daily.      . rosuvastatin (CRESTOR) 10 MG tablet Take 10 mg by mouth every morning.       No current facility-administered medications on file prior to visit.    ROS:  Out of a complete 14 system review of symptoms, Kathy [name] complains only of Kathy following symptoms, and all other reviewed systems are negative.  Memory disturbance Confusion Insomnia  Blood pressure 133/68, pulse 67, height 5\' 1"  (1.549 m), weight 127 lb (57.607 kg).  Physical Exam  General: Kathy [name] is alert and cooperative at Kathy time of Kathy examination.  Skin: No significant peripheral edema is noted.   Neurologic Exam  Mental status: Mini-Mental status examination done today shows a total score of 11/30. Kathy [name] is able to name 4 animals in 60 seconds.  Cranial nerves: Facial symmetry is present. Speech is normal,  no aphasia or dysarthria is noted. Extraocular movements are full. Visual fields are full.  Motor: Kathy [name] has good strength in all 4 extremities.  Coordination: Kathy [name] has good finger-nose-finger and heel-to-shin bilaterally. Kathy [name] has significant apraxia with Kathy use of Kathy extremities.  Gait and station: Kathy [name] has a slightly wide-based gait. Tandem gait was not tested. Romberg is negative. No drift is seen.  Reflexes: Deep tendon reflexes are symmetric, but they are depressed throughout.   Assessment/Plan:  1. Alzheimer's disease  Kathy [name]  continues to progress with her Alzheimer's disease. Kathy [name] now has a family member, her daughter, staying with her at all times. Kathy family is dedicated to keeping her in Kathy home environment. Kathy [name] will remain on Aricept and Namenda for now. Kathy [name] followup in 6-8 months.  Marlan Palau MD 06/17/2012 9:01 AM  [facility] [address] [place], [place] [address]  Phone [phone] Fax [phone]

## 2012-07-12 ENCOUNTER — Other Ambulatory Visit: Payer: Self-pay | Admitting: Neurology

## 2012-07-31 ENCOUNTER — Other Ambulatory Visit: Payer: Self-pay | Admitting: Neurology

## 2012-08-01 ENCOUNTER — Other Ambulatory Visit: Payer: Self-pay | Admitting: Neurology

## 2012-08-06 ENCOUNTER — Other Ambulatory Visit: Payer: Self-pay

## 2012-08-06 MED ORDER — MEMANTINE HCL 10 MG PO TABS: 10.0000 mg | ORAL_TABLET | Freq: Two times a day (BID) | ORAL | Status: DC

## 2012-08-06 MED ORDER — DONEPEZIL HCL 10 MG PO TABS: 10.0000 mg | ORAL_TABLET | Freq: Every day | ORAL | Status: DC

## 2012-11-30 ENCOUNTER — Telehealth: Payer: Self-pay | Admitting: Neurology

## 2012-12-20 ENCOUNTER — Ambulatory Visit: Payer: Medicare HMO | Admitting: Neurology

## 2013-02-01 ENCOUNTER — Encounter (INDEPENDENT_AMBULATORY_CARE_PROVIDER_SITE_OTHER): Payer: Self-pay

## 2013-02-01 ENCOUNTER — Ambulatory Visit (INDEPENDENT_AMBULATORY_CARE_PROVIDER_SITE_OTHER): Payer: Medicare HMO | Admitting: Neurology

## 2013-02-01 ENCOUNTER — Encounter: Payer: Self-pay | Admitting: Neurology

## 2013-02-01 VITALS — BP 101/62 | HR 72 | Wt 115.5 lb

## 2013-02-01 DIAGNOSIS — F0280 Dementia in other diseases classified elsewhere without behavioral disturbance: Secondary | ICD-10-CM

## 2013-02-01 MED ORDER — MEMANTINE HCL ER 28 MG PO CP24: 28.0000 mg | ORAL_CAPSULE | Freq: Every day | ORAL | Status: DC

## 2013-02-01 MED ORDER — DONEPEZIL HCL 10 MG PO TABS: 10.0000 mg | ORAL_TABLET | Freq: Every day | ORAL | Status: DC

## 2013-02-01 NOTE — Progress Notes (Signed)
Reason for visit: Memory disorder  Kathy [name] is an 79 y.o. female  History of present illness:  Kathy [name] is an 79 year old left-handed white female with a history of Alzheimer's disease. The patient has actually improved with her cognitive functioning according to her daughter. The patient went off of Crestor 5 months ago, and she may have improved significantly with her cognitive functioning off this medication. The patient remains on Aricept and Namenda, but she has continued to lose weight, dropping 13 pounds since last seen in April 2014. The patient appears to be eating quite well, taking in at least 3 or 4 meals a day plus snacks. The thyroid medication was recently reduced. The patient is sleeping well at night, and there has been no problem with agitation or hallucinations. Overall, the daughter is quite pleased with how she is doing. The patient indicates that she feels well. There have been no issues with nausea, or diarrhea.  Past Medical History  Diagnosis Date  . Dementia   . Dyslipidemia   . Vitamin D deficiency   . Alzheimer's disease 06/16/2012  . Ankle fracture, left     As a child  . Hiatal hernia     Past Surgical History  Procedure Laterality Date  . Ankle fracture surgery Left   . Hiatal hernia repair N/A   . Cataract extraction Left     Family History  Problem Relation Age of Onset  . Heart disease Mother   . Cancer Father   . Heart disease Sister     Social history:  reports that she has never smoked. She does not have any smokeless tobacco history on file. She reports that she does not drink alcohol or use illicit drugs.    Allergies  Allergen Reactions  . Codeine Other (See Comments)    Unknown     Medications:  Current Outpatient Prescriptions on File Prior to Visit  Medication Sig Dispense Refill  . aspirin EC 81 MG tablet Take 81 mg by mouth daily.      . vitamin B-12 (CYANOCOBALAMIN) 1000 MCG tablet Take 1,000 mcg by mouth daily.        No current facility-administered medications on file prior to visit.    ROS:  Out of a complete 14 system review of symptoms, the patient complains only of the following symptoms, and all other reviewed systems are negative.  Weight loss Memory loss, confusion  Blood pressure 101/62, pulse 72, weight 115 lb 8 oz (52.39 kg).  Physical Exam  General: The patient is alert and cooperative at the time of the examination.  Skin: No significant peripheral edema is noted.   Neurologic Exam  Mental status: The Mini-Mental status examination done today shows a total score of 11/30.  Cranial nerves: Facial symmetry is present. Speech is normal, no aphasia or dysarthria is noted. Extraocular movements are full. Visual fields are full.  Motor: The patient has good strength in all 4 extremities.  Sensory examination: Soft touch sensation is symmetric on the face, arms, and legs.  Coordination: The patient has good finger-nose-finger and heel-to-shin bilaterally.  Gait and station: The patient has a normal gait. Tandem gait is normal. Romberg is negative. No drift is seen.  Reflexes: Deep tendon reflexes are symmetric.   Assessment/Plan:  One. Alzheimer's disease  The patient continues to lose weight, but she has been eating quite well, and I will not therefore reduce the Aricept dose. The patient will be maintained on Aricept and Namenda,  and she will followup in 8 months. The patient appears to be stable on her Mini-Mental status examination.  Marlan Palau MD 02/01/2013 7:30 PM  [facility] [address] [place], [place] [address]  Phone [phone] Fax [phone]

## 2013-02-01 NOTE — Patient Instructions (Signed)
Alzheimer Disease Alzheimer Disease (AD) is a mental disorder. It causes memory loss and loss of other mental functions, such as learning, thinking, solving problems, communicating, and completing tasks. The mental losses interfere with the ability to perform daily activities at work, at home, or in social situations. AD usually starts in the late 60s or early 70s but can start earlier in life (familial form). The mental changes caused by AD are permanent and worsen over time. As the illness progresses, the ability to do even the simplest things is lost. Survival with AD ranges from several years to as long as 20 years. CAUSES AD is caused by abnormally high levels of a protein (beta-amyloid) in the brain. This protein forms very small deposits within and around the brain's nerve cells. These deposits prevent the nerve cells from working properly. Experts are not certain what causes the beta-amyloid deposits in AD. RISK FACTORS The following major risk factors have been identified:  Increasing age.  Certain genetic variations, such as Down syndrome (trisomy 21). SYMPTOMS The earliest mental change in AD is mild memory loss of recent events, names, or phone numbers. Other symptoms at the beginning of AD include loss of objects, minor loss of vocabulary, and difficulty with complex tasks, such as paying bills or driving in unfamiliar locations. At this stage, you are still able to perform daily activities but need greater effort, more time, or memory aids. Other mental functions deteriorate as AD worsens. These changes slowly go from mild to severe. Symptoms at this stage include:  Difficulty remembering You may not be able to recall personal information such as your address and telephone number. You may become confused about the date, the season of the year, or your location.  Difficulty maintaining attention You may forget what you wanted to say during conversations and repeat what you have already  said.  Difficulty learning new information or tasks You may not remember what you read or the name of a new friend you met.  Difficulty counting or doing math You may have difficulty with complex math problems. You may make mistakes in paying bills or managing your checkbook.  Poor reasoning and judgment You may make poor decisions or not dress right for the weather.  Difficulty communicating You may have regular difficulty remembering words, naming objects, expressing yourself clearly, or writing sentences that make sense.  Difficulty performing familiar daily activities You may get lost driving in familiar locations or need help eating, bathing, dressing, grooming, or using the toilet. You may have difficulty maintaining bladder or bowel control.  Difficulty recognizing familiar faces You may confuse family members or close friends with one another. You may not recognize a close relative or may mistake strangers for family. AD also may cause changes in personality and behavior. These changes include loss of interest or motivation, social withdrawal, anxiety, difficulty sleeping, uncharacteristic anger or combativeness, a false belief that someone is trying to harm you (paranoia), seeing things that are not real (hallucinations), or agitation. Confusion and disruptive behavior are often worse at night and may be triggered by changes in the environment or acute medical issues. DIAGNOSIS  AD is diagnosed through an assessment by your health care provider. During this assessment, your health care provider will do the following:  Ask you and your family, friends, or caregiver questions about your symptoms, their frequency, their duration and progression, and the effect they are having on your life.  Ask questions about your personal and family medical history and use   of alcohol or drugs, including prescription medicine.  Perform a physical exam and order blood tests and brain imaging exams. Your  health care provider may refer you to a specialist for detailed evaluation of your mental functions (neuropsychological testing).  Many different brain disorders, medical conditions, and certain substances can cause symptoms that resemble AD symptoms. These must be ruled out before AD can be diagnosed. If AD is diagnosed, it will be considered either "possible" or "probable" AD. "Possible" AD means that your symptoms are typical of AD and no other disorder is causing them. "Probable" AD means that you also have a family history of AD or genetic test results that support the diagnosis. Certain tests, mostly used in research studies, are highly specific for AD.  TREATMENT  There is currently no cure for AD. The goals of treatment are to:  Slow down the progression of the disease.  Preserve mental function as long as possible.  Manage behavioral symptoms.  Make life easier for the person with AD and their caregivers. The following treatment options are available:  Medicine Certain medicines may help slow memory loss by changing the level of certain chemicals in the brain. Medicine may also help with behavioral symptoms.  Talk therapy Talk therapy provides education, support, and memory aids for people with AD. It is most effective in the early stages of the illness.  Caregiving Caregivers may be family members, friends, or trained medical professionals. They help the person with AD with daily life activities. Caregiving may take place at home or at a nursing facility.  Family support groups These provide education, emotional support, and information about community resources to family members who are taking care of the person with AD. Document Released: 11/11/2003 Document Revised: 11/01/2012 Document Reviewed: 07/07/2012 [employer] Patient Information 2014 [employer].  

## 2013-03-09 ENCOUNTER — Other Ambulatory Visit: Payer: Self-pay

## 2013-03-09 MED ORDER — DONEPEZIL HCL 10 MG PO TABS: 10.0000 mg | ORAL_TABLET | Freq: Every day | ORAL | Status: DC

## 2013-07-11 ENCOUNTER — Emergency Department (HOSPITAL_COMMUNITY): Payer: Medicare HMO

## 2013-07-11 ENCOUNTER — Encounter (HOSPITAL_COMMUNITY): Payer: Self-pay | Admitting: Emergency Medicine

## 2013-07-11 ENCOUNTER — Inpatient Hospital Stay (HOSPITAL_COMMUNITY)
Admission: EM | Admit: 2013-07-11 | Discharge: 2013-07-19 | DRG: 469 | Disposition: A | Discharge status: Skilled Nursing Facility | Payer: Medicare HMO | Attending: Internal Medicine | Admitting: Internal Medicine

## 2013-07-11 DIAGNOSIS — E876 Hypokalemia: Secondary | ICD-10-CM

## 2013-07-11 DIAGNOSIS — S72033A Displaced midcervical fracture of unspecified femur, initial encounter for closed fracture: Principal | ICD-10-CM | POA: Diagnosis present

## 2013-07-11 DIAGNOSIS — S72009A Fracture of unspecified part of neck of unspecified femur, initial encounter for closed fracture: Secondary | ICD-10-CM | POA: Diagnosis present

## 2013-07-11 DIAGNOSIS — J9600 Acute respiratory failure, unspecified whether with hypoxia or hypercapnia: Secondary | ICD-10-CM | POA: Diagnosis not present

## 2013-07-11 DIAGNOSIS — R0902 Hypoxemia: Secondary | ICD-10-CM | POA: Diagnosis not present

## 2013-07-11 DIAGNOSIS — Y92009 Unspecified place in unspecified non-institutional (private) residence as the place of occurrence of the external cause: Secondary | ICD-10-CM

## 2013-07-11 DIAGNOSIS — Z66 Do not resuscitate: Secondary | ICD-10-CM | POA: Diagnosis not present

## 2013-07-11 DIAGNOSIS — Z515 Encounter for palliative care: Secondary | ICD-10-CM

## 2013-07-11 DIAGNOSIS — I2699 Other pulmonary embolism without acute cor pulmonale: Secondary | ICD-10-CM

## 2013-07-11 DIAGNOSIS — J9601 Acute respiratory failure with hypoxia: Secondary | ICD-10-CM

## 2013-07-11 DIAGNOSIS — R413 Other amnesia: Secondary | ICD-10-CM

## 2013-07-11 DIAGNOSIS — N390 Urinary tract infection, site not specified: Secondary | ICD-10-CM | POA: Diagnosis present

## 2013-07-11 DIAGNOSIS — E039 Hypothyroidism, unspecified: Secondary | ICD-10-CM | POA: Diagnosis present

## 2013-07-11 DIAGNOSIS — Z888 Allergy status to other drugs, medicaments and biological substances status: Secondary | ICD-10-CM

## 2013-07-11 DIAGNOSIS — G309 Alzheimer's disease, unspecified: Secondary | ICD-10-CM | POA: Diagnosis present

## 2013-07-11 DIAGNOSIS — E785 Hyperlipidemia, unspecified: Secondary | ICD-10-CM | POA: Diagnosis present

## 2013-07-11 DIAGNOSIS — I4510 Unspecified right bundle-branch block: Secondary | ICD-10-CM | POA: Diagnosis present

## 2013-07-11 DIAGNOSIS — S7291XA Unspecified fracture of right femur, initial encounter for closed fracture: Secondary | ICD-10-CM

## 2013-07-11 DIAGNOSIS — K449 Diaphragmatic hernia without obstruction or gangrene: Secondary | ICD-10-CM | POA: Diagnosis present

## 2013-07-11 DIAGNOSIS — R4181 Age-related cognitive decline: Secondary | ICD-10-CM | POA: Diagnosis present

## 2013-07-11 DIAGNOSIS — E559 Vitamin D deficiency, unspecified: Secondary | ICD-10-CM | POA: Diagnosis present

## 2013-07-11 DIAGNOSIS — Z7982 Long term (current) use of aspirin: Secondary | ICD-10-CM

## 2013-07-11 DIAGNOSIS — D500 Iron deficiency anemia secondary to blood loss (chronic): Secondary | ICD-10-CM

## 2013-07-11 DIAGNOSIS — F0280 Dementia in other diseases classified elsewhere without behavioral disturbance: Secondary | ICD-10-CM

## 2013-07-11 DIAGNOSIS — D62 Acute posthemorrhagic anemia: Secondary | ICD-10-CM | POA: Diagnosis not present

## 2013-07-11 DIAGNOSIS — Z7901 Long term (current) use of anticoagulants: Secondary | ICD-10-CM

## 2013-07-11 DIAGNOSIS — Z79899 Other long term (current) drug therapy: Secondary | ICD-10-CM

## 2013-07-11 DIAGNOSIS — W010XXA Fall on same level from slipping, tripping and stumbling without subsequent striking against object, initial encounter: Secondary | ICD-10-CM | POA: Diagnosis present

## 2013-07-11 DIAGNOSIS — Z9849 Cataract extraction status, unspecified eye: Secondary | ICD-10-CM

## 2013-07-11 DIAGNOSIS — Z8249 Family history of ischemic heart disease and other diseases of the circulatory system: Secondary | ICD-10-CM

## 2013-07-11 DIAGNOSIS — J90 Pleural effusion, not elsewhere classified: Secondary | ICD-10-CM | POA: Diagnosis present

## 2013-07-11 LAB — CBC
HCT: 42.7 % (ref 36.0–46.0)
Hemoglobin: 13.6 g/dL (ref 12.0–15.0)
MCH: 31.8 pg (ref 26.0–34.0)
MCHC: 31.9 g/dL (ref 30.0–36.0)
MCV: 99.8 fL (ref 78.0–100.0)
PLATELETS: 211 10*3/uL (ref 150–400)
RBC: 4.28 MIL/uL (ref 3.87–5.11)
RDW: 14.6 % (ref 11.5–15.5)
WBC: 11.1 10*3/uL — AB (ref 4.0–10.5)

## 2013-07-11 LAB — BASIC METABOLIC PANEL
BUN: 11 mg/dL (ref 6–23)
CHLORIDE: 105 meq/L (ref 96–112)
CO2: 28 meq/L (ref 19–32)
Calcium: 9.1 mg/dL (ref 8.4–10.5)
Creatinine, Ser: 0.86 mg/dL (ref 0.50–1.10)
GFR calc non Af Amer: 59 mL/min — ABNORMAL LOW (ref >90)
GFR, EST AFRICAN AMERICAN: 68 mL/min — AB (ref >90)
Glucose, Bld: 139 mg/dL — ABNORMAL HIGH (ref 70–99)
Potassium: 4 mEq/L (ref 3.7–5.3)
SODIUM: 146 meq/L (ref 137–147)

## 2013-07-11 MED ORDER — HYDROMORPHONE HCL PF 1 MG/ML IJ SOLN
0.5000 mg | Freq: Once | INTRAMUSCULAR | Status: AC
  Administered 2013-07-11: 0.5 mg via INTRAVENOUS
  Filled 2013-07-11: qty 1

## 2013-07-11 NOTE — ED Provider Notes (Signed)
CSN: [id number]     Arrival date & time 07/11/13  1956 History   First MD Initiated Contact with Patient 07/11/13 2009     Chief Complaint  Patient presents with  . Fall     (Consider location/radiation/quality/duration/timing/severity/associated sxs/prior Treatment) HPI Pt presenting with c/o fall.  She states she was on the edge of her bed taking off her shoes and slipped off falling to the floor.  C/o right hip pain.  Was not able to get up and ambulate on her own.  No syncope or chest pain prior to fall.  Did not strike head.  No neck or back pain.  Pt was given fentanyl via EMS which has controlled her pain.  Pain worse with movement and palpation.  There are no other associated systemic symptoms, there are no other alleviating or modifying factors.   Past Medical History  Diagnosis Date  . Dementia   . Dyslipidemia   . Vitamin D deficiency   . Alzheimer's disease 06/16/2012  . Ankle fracture, left     As a child  . Hiatal hernia    Past Surgical History  Procedure Laterality Date  . Ankle fracture surgery Left   . Hiatal hernia repair N/A   . Cataract extraction Left    Family History  Problem Relation Age of Onset  . Heart disease Mother   . Cancer Father   . Heart disease Sister    History  Substance Use Topics  . Smoking status: Never Smoker   . Smokeless tobacco: Not on file  . Alcohol Use: No   OB History   Grav Para Term Preterm Abortions TAB SAB Ect Mult Living                 Review of Systems ROS reviewed and all otherwise negative except for mentioned in HPI    Allergies  Codeine  Home Medications   Prior to Admission medications   Medication Sig Start Date End Date Taking? Authorizing Provider  aspirin EC 81 MG tablet Take 81 mg by mouth daily.   Yes Historical Provider, MD  donepezil (ARICEPT) 10 MG tablet Take 1 tablet (10 mg total) by mouth daily. 03/09/13  Yes York Spanielharles K Willis, MD  ergocalciferol (VITAMIN D2) 50000 UNITS capsule Take  50,000 Units by mouth every 30 (thirty) days.    Yes Historical Provider, MD  levothyroxine (SYNTHROID, LEVOTHROID) 88 MCG tablet Take 88 mcg by mouth daily before breakfast.   Yes Historical Provider, MD  Memantine HCl ER (NAMENDA XR) 28 MG CP24 Take 28 mg by mouth daily. 02/01/13  Yes York Spanielharles K Willis, MD  vitamin B-12 (CYANOCOBALAMIN) 1000 MCG tablet Take 1,000 mcg by mouth daily.   Yes Historical Provider, MD   BP 118/62  Pulse 77  Temp(Src) 97.7 F (36.5 C) (Oral)  Resp 18  SpO2 95% Vitals reviewed Physical Exam Physical Examination: General appearance - alert, well appearing, and in no distress Mental status - alert, oriented to person, place, and time Eyes - no conjunctival injection, no scleral icterus Mouth - mucous membranes moist, pharynx normal without lesions Chest - clear to auscultation, no wheezes, rales or rhonchi, symmetric air entry Heart - normal rate, regular rhythm, normal S1, S2, no murmurs, rubs, clicks or gallops Abdomen - soft, nontender, nondistended, no masses or organomegaly Pelvis- stable, no tenderness to palpation Neurological - alert, oriented, normal speech, sensation intact distally, strength full in extremities x 4 Musculoskeletal - right LE, foreshortened and rotated, pain with ROM  of RLE, otherwise no joint tenderness, deformity or swelling Extremities - peripheral pulses normal, no pedal edema, no clubbing or cyanosis Skin - normal coloration and turgor, no rashes  ED Course  Procedures (including critical care time)  11:24 PM d/w Dr. Thomasena Edisollins, orthopedics, he will see patient in the morning, requests NPO and bed rest.  Will call triad for admission.  11:42 PM d/w Dr. Alvester MorinNewton, triad, he will see patient for admission.   Labs Review Labs Reviewed  CBC - Abnormal; Notable for the following:    WBC 11.1 (*)    All other components within normal limits  BASIC METABOLIC PANEL - Abnormal; Notable for the following:    Glucose, Bld 139 (*)    GFR  calc non Af Amer 59 (*)    GFR calc Af Amer 68 (*)    All other components within normal limits    Imaging Review Dg Hip Complete Right  07/11/2013   CLINICAL DATA:  Right hip pain after fall.  EXAM: RIGHT HIP - COMPLETE 2+ VIEW  COMPARISON:  None.  FINDINGS: Moderately displaced fracture is seen involving the right proximal femoral neck. Femoral head is well situated within the acetabulum.  IMPRESSION: Moderately displaced right proximal femoral neck fracture.   Electronically Signed   By: Roque LiasJames  Green M.D.   On: 07/11/2013 20:56     EKG Interpretation   Date/Time:  Wednesday July 11 2013 20:10:07 EDT Ventricular Rate:  69 PR Interval:  203 QRS Duration: 150 QT Interval:  485 QTC Calculation: 520 R Axis:   -75 Text Interpretation:  Sinus rhythm Ventricular premature complex RBBB and  LAFB Baseline wander in lead(s) II III aVF RBBB is new since prior tracing  Confirmed by [facility]  MD, Brand Siever [phone]([id number]) on 07/11/2013 9:52:39 PM      MDM   Final diagnoses:  Femur fracture, right    Pt presenting after mechanical fall now with right femoral neck fracture.  D/w orthopedics- will see tomorrow.  Pain controlled after meds in the ED.  D/w triad for admission.      Ethelda ChickMartha K Linker, MD 07/12/13 [phone]

## 2013-07-11 NOTE — ED Notes (Signed)
Patient transported to X-ray 

## 2013-07-11 NOTE — ED Notes (Signed)
Pt to ED via [facility] for evaluation of un witnessed fall at home.  Pt complaining of right hip pain, rotation noted to right leg, pedal pulse strong.  EMS administered 50 Fentanyl en route- pt denies pain upon arrival to ED.  Pelvis braced with sheet at present.

## 2013-07-11 NOTE — ED Notes (Signed)
Family upset about delay in results- Dr. Karma GanjaLinker made aware.

## 2013-07-12 ENCOUNTER — Inpatient Hospital Stay (HOSPITAL_COMMUNITY): Payer: Medicare HMO

## 2013-07-12 ENCOUNTER — Encounter (HOSPITAL_COMMUNITY): Payer: Self-pay | Admitting: Rehabilitation

## 2013-07-12 ENCOUNTER — Encounter (HOSPITAL_COMMUNITY): Payer: Self-pay | Admitting: Anesthesiology

## 2013-07-12 ENCOUNTER — Encounter (HOSPITAL_COMMUNITY)
Admission: EM | Disposition: A | Discharge status: Skilled Nursing Facility | Payer: Self-pay | Source: Home / Self Care | Attending: Internal Medicine

## 2013-07-12 DIAGNOSIS — G309 Alzheimer's disease, unspecified: Secondary | ICD-10-CM

## 2013-07-12 DIAGNOSIS — S7290XA Unspecified fracture of unspecified femur, initial encounter for closed fracture: Secondary | ICD-10-CM

## 2013-07-12 DIAGNOSIS — F0280 Dementia in other diseases classified elsewhere without behavioral disturbance: Secondary | ICD-10-CM

## 2013-07-12 DIAGNOSIS — S72009A Fracture of unspecified part of neck of unspecified femur, initial encounter for closed fracture: Secondary | ICD-10-CM | POA: Diagnosis present

## 2013-07-12 LAB — COMPREHENSIVE METABOLIC PANEL
ALK PHOS: 97 U/L (ref 39–117)
ALT: 10 U/L (ref 0–35)
AST: 15 U/L (ref 0–37)
Albumin: 3.1 g/dL — ABNORMAL LOW (ref 3.5–5.2)
BUN: 10 mg/dL (ref 6–23)
CO2: 26 mEq/L (ref 19–32)
Calcium: 8.9 mg/dL (ref 8.4–10.5)
Chloride: 105 mEq/L (ref 96–112)
Creatinine, Ser: 0.77 mg/dL (ref 0.50–1.10)
GFR calc non Af Amer: 73 mL/min — ABNORMAL LOW (ref >90)
GFR, EST AFRICAN AMERICAN: 84 mL/min — AB (ref >90)
GLUCOSE: 123 mg/dL — AB (ref 70–99)
POTASSIUM: 4.2 meq/L (ref 3.7–5.3)
Sodium: 144 mEq/L (ref 137–147)
TOTAL PROTEIN: 6.5 g/dL (ref 6.0–8.3)
Total Bilirubin: 0.7 mg/dL (ref 0.3–1.2)

## 2013-07-12 LAB — URINALYSIS, ROUTINE W REFLEX MICROSCOPIC
BILIRUBIN URINE: NEGATIVE
Glucose, UA: NEGATIVE mg/dL
Hgb urine dipstick: NEGATIVE
Ketones, ur: NEGATIVE mg/dL
Leukocytes, UA: NEGATIVE
Nitrite: NEGATIVE
PROTEIN: NEGATIVE mg/dL
SPECIFIC GRAVITY, URINE: 1.017 (ref 1.005–1.030)
Urobilinogen, UA: 1 mg/dL (ref 0.0–1.0)
pH: 6 (ref 5.0–8.0)

## 2013-07-12 LAB — CBC WITH DIFFERENTIAL/PLATELET
BASOS ABS: 0 10*3/uL (ref 0.0–0.1)
Basophils Relative: 0 % (ref 0–1)
EOS ABS: 0 10*3/uL (ref 0.0–0.7)
EOS PCT: 0 % (ref 0–5)
HCT: 41.1 % (ref 36.0–46.0)
HEMOGLOBIN: 13.1 g/dL (ref 12.0–15.0)
Lymphocytes Relative: 15 % (ref 12–46)
Lymphs Abs: 1.5 10*3/uL (ref 0.7–4.0)
MCH: 31.9 pg (ref 26.0–34.0)
MCHC: 31.9 g/dL (ref 30.0–36.0)
MCV: 100 fL (ref 78.0–100.0)
MONOS PCT: 7 % (ref 3–12)
Monocytes Absolute: 0.8 10*3/uL (ref 0.1–1.0)
NEUTROS ABS: 8 10*3/uL — AB (ref 1.7–7.7)
Neutrophils Relative %: 78 % — ABNORMAL HIGH (ref 43–77)
Platelets: 200 10*3/uL (ref 150–400)
RBC: 4.11 MIL/uL (ref 3.87–5.11)
RDW: 14.5 % (ref 11.5–15.5)
WBC: 10.3 10*3/uL (ref 4.0–10.5)

## 2013-07-12 LAB — TROPONIN I
Troponin I: <0.3 ng/mL (ref <0.30)
Troponin I: <0.3 ng/mL (ref <0.30)

## 2013-07-12 LAB — HEMOGLOBIN A1C
Hgb A1c MFr Bld: 5.7 % — ABNORMAL HIGH (ref <5.7)
Mean Plasma Glucose: 117 mg/dL — ABNORMAL HIGH (ref <117)

## 2013-07-12 LAB — PROTIME-INR
INR: 1.06 (ref 0.00–1.49)
Prothrombin Time: 13.6 seconds (ref 11.6–15.2)

## 2013-07-12 LAB — D-DIMER, QUANTITATIVE: D-Dimer, Quant: 5.53 ug/mL-FEU — ABNORMAL HIGH (ref 0.00–0.48)

## 2013-07-12 SURGERY — HEMIARTHROPLASTY, HIP, DIRECT ANTERIOR APPROACH, FOR FRACTURE
Anesthesia: General

## 2013-07-12 MED ORDER — NALOXONE HCL 0.4 MG/ML IJ SOLN
INTRAMUSCULAR | Status: AC
  Administered 2013-07-12: 0.4 mg via INTRAVENOUS
  Filled 2013-07-12: qty 1

## 2013-07-12 MED ORDER — ASPIRIN EC 81 MG PO TBEC
81.0000 mg | DELAYED_RELEASE_TABLET | Freq: Every day | ORAL | Status: DC
  Administered 2013-07-13 – 2013-07-19 (×7): 81 mg via ORAL
  Filled 2013-07-12 (×8): qty 1

## 2013-07-12 MED ORDER — DEXTROSE 5 % IV SOLN
1.0000 g | Freq: Every day | INTRAVENOUS | Status: DC
  Administered 2013-07-12 – 2013-07-17 (×6): 1 g via INTRAVENOUS
  Filled 2013-07-12 (×6): qty 10

## 2013-07-12 MED ORDER — SODIUM CHLORIDE 0.9 % IV SOLN
INTRAVENOUS | Status: DC
  Administered 2013-07-12 – 2013-07-16 (×4): via INTRAVENOUS
  Administered 2013-07-16: 500 mL via INTRAVENOUS

## 2013-07-12 MED ORDER — HYDROMORPHONE HCL PF 1 MG/ML IJ SOLN
0.5000 mg | INTRAMUSCULAR | Status: DC | PRN
Start: 2013-07-12 — End: 2013-07-12
  Administered 2013-07-12 (×5): 0.5 mg via INTRAVENOUS
  Filled 2013-07-12 (×6): qty 1

## 2013-07-12 MED ORDER — IOHEXOL 350 MG/ML SOLN
100.0000 mL | Freq: Once | INTRAVENOUS | Status: AC | PRN
  Administered 2013-07-12: 100 mL via INTRAVENOUS

## 2013-07-12 MED ORDER — HEPARIN BOLUS VIA INFUSION
2500.0000 [IU] | Freq: Once | INTRAVENOUS | Status: AC
  Administered 2013-07-13: 2500 [IU] via INTRAVENOUS
  Filled 2013-07-12: qty 2500

## 2013-07-12 MED ORDER — NALOXONE HCL 0.4 MG/ML IJ SOLN
0.4000 mg | INTRAMUSCULAR | Status: DC | PRN
  Administered 2013-07-12: 0.4 mg via INTRAVENOUS

## 2013-07-12 MED ORDER — VITAMIN D (ERGOCALCIFEROL) 1.25 MG (50000 UNIT) PO CAPS
50000.0000 [IU] | ORAL_CAPSULE | ORAL | Status: DC
  Filled 2013-07-12: qty 1

## 2013-07-12 MED ORDER — DONEPEZIL HCL 10 MG PO TABS
10.0000 mg | ORAL_TABLET | Freq: Every day | ORAL | Status: DC
  Administered 2013-07-14 – 2013-07-19 (×6): 10 mg via ORAL
  Filled 2013-07-12 (×8): qty 1

## 2013-07-12 MED ORDER — VITAMIN B-12 1000 MCG PO TABS
1000.0000 ug | ORAL_TABLET | Freq: Every day | ORAL | Status: DC
  Administered 2013-07-14 – 2013-07-19 (×6): 1000 ug via ORAL
  Filled 2013-07-12 (×8): qty 1

## 2013-07-12 MED ORDER — HEPARIN (PORCINE) IN NACL 100-0.45 UNIT/ML-% IJ SOLN
700.0000 [IU]/h | INTRAMUSCULAR | Status: DC
  Administered 2013-07-13: 700 [IU]/h via INTRAVENOUS
  Filled 2013-07-12: qty 250

## 2013-07-12 MED ORDER — HYDROMORPHONE HCL PF 1 MG/ML IJ SOLN
0.5000 mg | INTRAMUSCULAR | Status: DC | PRN
  Administered 2013-07-12: 0.25 mg via INTRAVENOUS
  Administered 2013-07-13 (×2): 0.5 mg via INTRAVENOUS
  Administered 2013-07-13: 0.25 mg via INTRAVENOUS
  Administered 2013-07-15 – 2013-07-16 (×2): 0.5 mg via INTRAVENOUS
  Filled 2013-07-12 (×6): qty 1

## 2013-07-12 MED ORDER — LEVOTHYROXINE SODIUM 88 MCG PO TABS
88.0000 ug | ORAL_TABLET | Freq: Every day | ORAL | Status: DC
  Administered 2013-07-13 – 2013-07-19 (×6): 88 ug via ORAL
  Filled 2013-07-12 (×9): qty 1

## 2013-07-12 MED ORDER — MEMANTINE HCL ER 28 MG PO CP24
28.0000 mg | ORAL_CAPSULE | Freq: Every day | ORAL | Status: DC
  Administered 2013-07-13 – 2013-07-19 (×7): 28 mg via ORAL
  Filled 2013-07-12 (×8): qty 28

## 2013-07-12 MED ORDER — HEPARIN SODIUM (PORCINE) 5000 UNIT/ML IJ SOLN
5000.0000 [IU] | Freq: Three times a day (TID) | INTRAMUSCULAR | Status: DC
  Filled 2013-07-12 (×4): qty 1

## 2013-07-12 SURGICAL SUPPLY — 46 items
BLADE 10 SAFETY STRL DISP (BLADE) ×4 IMPLANT
BLADE SAW SAG 73X25 THK (BLADE) ×2
BLADE SAW SGTL 73X25 THK (BLADE) ×2 IMPLANT
BRUSH FEMORAL CANAL (MISCELLANEOUS) IMPLANT
CLOSURE WOUND 1/2 X4 (GAUZE/BANDAGES/DRESSINGS)
COVER SURGICAL LIGHT HANDLE (MISCELLANEOUS) ×4 IMPLANT
DRAPE INCISE IOBAN 66X45 STRL (DRAPES) ×8 IMPLANT
DRAPE ORTHO SPLIT 77X108 STRL (DRAPES) ×6
DRAPE SURG ORHT 6 SPLT 77X108 (DRAPES) ×4 IMPLANT
DRAPE U-SHAPE 47X51 STRL (DRAPES) ×4 IMPLANT
DURAPREP 26ML APPLICATOR (WOUND CARE) ×4 IMPLANT
ELECT BLADE 4.0 EZ CLEAN MEGAD (MISCELLANEOUS)
ELECT REM PT RETURN 9FT ADLT (ELECTROSURGICAL) ×3
ELECTRODE BLDE 4.0 EZ CLN MEGD (MISCELLANEOUS) IMPLANT
ELECTRODE REM PT RTRN 9FT ADLT (ELECTROSURGICAL) ×2 IMPLANT
GLOVE BIOGEL PI ORTHO PRO 7.5 (GLOVE) ×2
GLOVE BIOGEL PI ORTHO PRO SZ8 (GLOVE) ×2
GLOVE ORTHO TXT STRL SZ7.5 (GLOVE) ×4 IMPLANT
GLOVE PI ORTHO PRO STRL 7.5 (GLOVE) ×2 IMPLANT
GLOVE PI ORTHO PRO STRL SZ8 (GLOVE) ×2 IMPLANT
GLOVE SURG ORTHO 8.5 STRL (GLOVE) ×4 IMPLANT
GOWN STRL REUS W/ TWL XL LVL3 (GOWN DISPOSABLE) ×6 IMPLANT
GOWN STRL REUS W/TWL XL LVL3 (GOWN DISPOSABLE) ×9
HANDPIECE INTERPULSE COAX TIP (DISPOSABLE)
KIT BASIN OR (CUSTOM PROCEDURE TRAY) ×4 IMPLANT
KIT ROOM TURNOVER OR (KITS) ×4 IMPLANT
MANIFOLD NEPTUNE II (INSTRUMENTS) ×4 IMPLANT
NS IRRIG 1000ML POUR BTL (IV SOLUTION) ×4 IMPLANT
PACK TOTAL JOINT (CUSTOM PROCEDURE TRAY) ×4 IMPLANT
PAD ARMBOARD 7.5X6 YLW CONV (MISCELLANEOUS) ×8 IMPLANT
SET HNDPC FAN SPRY TIP SCT (DISPOSABLE) IMPLANT
STAPLER VISISTAT 35W (STAPLE) ×4 IMPLANT
STRIP CLOSURE SKIN 1/2X4 (GAUZE/BANDAGES/DRESSINGS) IMPLANT
SUT ETHIBOND NAB CT1 #1 30IN (SUTURE) ×12 IMPLANT
SUT MNCRL AB 3-0 PS2 18 (SUTURE) IMPLANT
SUT VIC AB 0 CT1 27 (SUTURE) ×3
SUT VIC AB 0 CT1 27XBRD ANBCTR (SUTURE) ×2 IMPLANT
SUT VIC AB 1 CT1 27 (SUTURE)
SUT VIC AB 1 CT1 27XBRD ANBCTR (SUTURE) IMPLANT
SUT VIC AB 2-0 CT1 27 (SUTURE) ×3
SUT VIC AB 2-0 CT1 TAPERPNT 27 (SUTURE) ×2 IMPLANT
TOWEL OR 17X24 6PK STRL BLUE (TOWEL DISPOSABLE) ×4 IMPLANT
TOWEL OR 17X26 10 PK STRL BLUE (TOWEL DISPOSABLE) ×4 IMPLANT
TOWER CARTRIDGE SMART MIX (DISPOSABLE) IMPLANT
TRAY FOLEY CATH 16FRSI W/METER (SET/KITS/TRAYS/PACK) IMPLANT
WATER STERILE IRR 1000ML POUR (IV SOLUTION) ×12 IMPLANT

## 2013-07-12 NOTE — Progress Notes (Signed)
Orthopedics Progress Note  Subjective: Patient resting comfortably  Objective:  Filed Vitals:   07/12/13 1740  BP: 114/65  Pulse: 94  Temp: 97.5 F (36.4 C)  Resp: 22    General: Awake and alert  Musculoskeletal: deferred Neurovascularly intact  Lab Results  Component Value Date   WBC 10.3 07/12/2013   HGB 13.1 07/12/2013   HCT 41.1 07/12/2013   MCV 100.0 07/12/2013   PLT 200 07/12/2013       Component Value Date/Time   NA 144 07/12/2013 0147   K 4.2 07/12/2013 0147   CL 105 07/12/2013 0147   CO2 26 07/12/2013 0147   GLUCOSE 123* 07/12/2013 0147   BUN 10 07/12/2013 0147   CREATININE 0.77 07/12/2013 0147   CALCIUM 8.9 07/12/2013 0147   GFRNONAA 73* 07/12/2013 0147   GFRAA 84* 07/12/2013 0147    Lab Results  Component Value Date   INR 1.06 07/12/2013    Assessment/Plan: In discussion with anesthesia of the patient and her desaturation today, they recommended that I speak directly to the MD who evaluated [name] today  Dr Suanne MarkerViyuoh.  Dr Suanne MarkerViyuoh is NOT clearing her for surgery because of the high fever of unknown origin.  Awaiting blood and urine culture results.  Also will need to try to wean the patient off O2 to see if there is a persistent desaturation that will require a further work up and optomization.  I discussed this with the family at the bedside who understands and agrees with the further work up in progress to make the surgery as safe as possible.  Will let Dr Charlann Boxerlin know who will take over her care tomorrow.  Almedia BallsSteven R. Ranell PatrickNorris, MD 07/12/2013 9:21 PM

## 2013-07-12 NOTE — Significant Event (Signed)
Rapid Response Event Note  Overview: Time Called: 1441 Arrival Time: 1444 Event Type: Neurologic;Respiratory  Initial Focused Assessment: Patient with history of Alzheimer s/p femur fracture yesterday, awaiting surgery later today.  Patient receiving Dilaudid for pain PRN.   Per RN patient lethargic but arousable.  O2 sat 65 on 3L [place].  Recommended Narcan. Upon arrival Narcan being given, patient on 50% venti O2 sats 94%. Patient awake, not able to cough on command, says "I will",  Per family this is her normal mental status. Lung sounds clear, decreased bases.  Heart tones regular. BP 131/62  HR 107 RR 24  O2 sat 94 on 50% venti,  Rectal temp 102.7 (last night her HR was 60-70s) Daughter at bedside during event.  Interventions: Narcan given upon arrival Attempted cough and deep breathing, unable. Repositioned patient Dr Suanne MarkerViyuoh notified and to bedside to assess patient. Attempted to wean patient to 4L [place], o2 sat 89-91, placed back on 50% venti, o2 sat 95% Plan labs, BC, UC Continuous O2 Will continue to monitor patient.  RN to call if assistance needed.  Event Summary: Name of Physician Notified: Dr Suanne MarkerViyuoh at 1440    [facility]          [facility]

## 2013-07-12 NOTE — Consult Note (Signed)
Reason for Consult:right femoral neck fracture Referring Physician:   [name] is an 80 y.o. female.  HPI: patient fell getting out of bed, sustaining a right femoral neck fracture evaluated in [facility] emergency department. Dr Alvan Dame consulted for orthopaedic management of injury.  Past Medical History  Diagnosis Date  . Dementia   . Dyslipidemia   . Vitamin D deficiency   . Alzheimer's disease 06/16/2012  . Ankle fracture, left     As a child  . Hiatal hernia     Past Surgical History  Procedure Laterality Date  . Ankle fracture surgery Left   . Hiatal hernia repair N/A   . Cataract extraction Left     Family History  Problem Relation Age of Onset  . Heart disease Mother   . Cancer Father   . Heart disease Sister     Social History:  reports that she has never smoked. She does not have any smokeless tobacco history on file. She reports that she does not drink alcohol or use illicit drugs.  Allergies:  Allergies  Allergen Reactions  . Codeine Other (See Comments)    Unknown     Medications:  Prior to Admission:  Prescriptions prior to admission  Medication Sig Dispense Refill  . aspirin EC 81 MG tablet Take 81 mg by mouth daily.      Marland Kitchen donepezil (ARICEPT) 10 MG tablet Take 1 tablet (10 mg total) by mouth daily.  90 tablet  3  . ergocalciferol (VITAMIN D2) 50000 UNITS capsule Take 50,000 Units by mouth every 30 (thirty) days.       Marland Kitchen levothyroxine (SYNTHROID, LEVOTHROID) 88 MCG tablet Take 88 mcg by mouth daily before breakfast.      . Memantine HCl ER (NAMENDA XR) 28 MG CP24 Take 28 mg by mouth daily.  90 capsule  3  . vitamin B-12 (CYANOCOBALAMIN) 1000 MCG tablet Take 1,000 mcg by mouth daily.        Results for orders placed during the hospital encounter of 07/11/13 (from the past 48 hour(s))  CBC     Status: Abnormal   Collection Time    07/11/13  9:05 PM      Result Value Ref Range   WBC 11.1 (*) 4.0 - 10.5 K/uL   RBC 4.28  3.87 - 5.11 MIL/uL    Hemoglobin 13.6  12.0 - 15.0 g/dL   HCT 42.7  36.0 - 46.0 %   MCV 99.8  78.0 - 100.0 fL   MCH 31.8  26.0 - 34.0 pg   MCHC 31.9  30.0 - 36.0 g/dL   RDW 14.6  11.5 - 15.5 %   Platelets 211  150 - 400 K/uL  BASIC METABOLIC PANEL     Status: Abnormal   Collection Time    07/11/13  9:05 PM      Result Value Ref Range   Sodium 146  137 - 147 mEq/L   Potassium 4.0  3.7 - 5.3 mEq/L   Chloride 105  96 - 112 mEq/L   CO2 28  19 - 32 mEq/L   Glucose, Bld 139 (*) 70 - 99 mg/dL   BUN 11  6 - 23 mg/dL   Creatinine, Ser 0.86  0.50 - 1.10 mg/dL   Calcium 9.1  8.4 - 10.5 mg/dL   GFR calc non Af Amer 59 (*) >90 mL/min   GFR calc Af Amer 68 (*) >90 mL/min   Comment: (NOTE)     The eGFR has been  calculated using the CKD EPI equation.     This calculation has not been validated in all clinical situations.     eGFR's persistently <90 mL/min signify possible Chronic Kidney     Disease.  COMPREHENSIVE METABOLIC PANEL     Status: Abnormal   Collection Time    07/12/13  1:47 AM      Result Value Ref Range   Sodium 144  137 - 147 mEq/L   Potassium 4.2  3.7 - 5.3 mEq/L   Chloride 105  96 - 112 mEq/L   CO2 26  19 - 32 mEq/L   Glucose, Bld 123 (*) 70 - 99 mg/dL   BUN 10  6 - 23 mg/dL   Creatinine, Ser 0.77  0.50 - 1.10 mg/dL   Calcium 8.9  8.4 - 10.5 mg/dL   Total Protein 6.5  6.0 - 8.3 g/dL   Albumin 3.1 (*) 3.5 - 5.2 g/dL   AST 15  0 - 37 U/L   ALT 10  0 - 35 U/L   Alkaline Phosphatase 97  39 - 117 U/L   Total Bilirubin 0.7  0.3 - 1.2 mg/dL   GFR calc non Af Amer 73 (*) >90 mL/min   GFR calc Af Amer 84 (*) >90 mL/min   Comment: (NOTE)     The eGFR has been calculated using the CKD EPI equation.     This calculation has not been validated in all clinical situations.     eGFR's persistently <90 mL/min signify possible Chronic Kidney     Disease.  CBC WITH DIFFERENTIAL     Status: Abnormal   Collection Time    07/12/13  1:47 AM      Result Value Ref Range   WBC 10.3  4.0 - 10.5 K/uL   RBC  4.11  3.87 - 5.11 MIL/uL   Hemoglobin 13.1  12.0 - 15.0 g/dL   HCT 41.1  36.0 - 46.0 %   MCV 100.0  78.0 - 100.0 fL   MCH 31.9  26.0 - 34.0 pg   MCHC 31.9  30.0 - 36.0 g/dL   RDW 14.5  11.5 - 15.5 %   Platelets 200  150 - 400 K/uL   Neutrophils Relative % 78 (*) 43 - 77 %   Neutro Abs 8.0 (*) 1.7 - 7.7 K/uL   Lymphocytes Relative 15  12 - 46 %   Lymphs Abs 1.5  0.7 - 4.0 K/uL   Monocytes Relative 7  3 - 12 %   Monocytes Absolute 0.8  0.1 - 1.0 K/uL   Eosinophils Relative 0  0 - 5 %   Eosinophils Absolute 0.0  0.0 - 0.7 K/uL   Basophils Relative 0  0 - 1 %   Basophils Absolute 0.0  0.0 - 0.1 K/uL    Dg Hip Complete Right  07/11/2013   CLINICAL DATA:  Right hip pain after fall.  EXAM: RIGHT HIP - COMPLETE 2+ VIEW  COMPARISON:  None.  FINDINGS: Moderately displaced fracture is seen involving the right proximal femoral neck. Femoral head is well situated within the acetabulum.  IMPRESSION: Moderately displaced right proximal femoral neck fracture.   Electronically Signed   By: Sabino Dick M.D.   On: 07/11/2013 20:56   Dg Chest Port 1 View  07/12/2013   CLINICAL DATA:  Preop right leg fracture  EXAM: PORTABLE CHEST - 1 VIEW  COMPARISON:  DG CHEST 2 VIEW dated 12/21/2008; DG HIP COMPLETE*R* dated 07/11/2013  FINDINGS: Normal  cardiac silhouette with ectatic aorta. Lungs are hyperinflated with bronchitic markings. No effusion, infiltrate, or pneumothorax.  IMPRESSION: 1.  No acute cardiopulmonary process. 2. Hyperinflated lungs.   Electronically Signed   By: Suzy Bouchard M.D.   On: 07/12/2013 10:46    ROS Blood pressure 117/60, pulse 73, temperature 97.9 F (36.6 C), temperature source Oral, resp. rate 18, height 5' 3"  (1.6 m), weight 47.356 kg (104 lb 6.4 oz), SpO2 90.00%. Physical Exam Right leg shortened and internally rotated; no neurovascular deficits noted,although patient unable to fully follow commands to fully assess status. Skin intact over area for surgical  intervention Assessment/Plan: Right femoral neck fracture-to OR this pm for right hip hemiarthroplasty Consent signed by daughter npo  Benedetto Goad 07/12/2013, 12:01 PM  [phone]

## 2013-07-12 NOTE — Progress Notes (Signed)
INITIAL NUTRITION ASSESSMENT  DOCUMENTATION CODES Per approved criteria  -Not Applicable   INTERVENTION: Diet advancement per MD When diet advance provide Magic Cup BID and Snacks BID  NUTRITION DIAGNOSIS: Inadequate oral intake related to dementia and decreased appetite as evidenced by 18% weight loss in the past year.   Goal: Pt to meet >/= 90% of their estimated nutrition needs   Monitor:  Diet advancement, PO intake, weight trend, labs  Reason for Assessment: Malnutrition Screening Tool  80 y.o. female  Admitting Dx: <principal problem not specified>  ASSESSMENT: 80 y.o. Female with history of dementia. Patient fell getting out of bed, sustaining a right femoral [facility] fracture [facility] Dr Charlann Boxerlin consulted for orthopaedic management of injury.  Pt with advanced dementia and asleep at time of visit. Per daughter at bedside pt eats like a bird and has lost 26 lbs in the past year but, pt's doctor told daughter weight loss was expected with dementia. Daughter states pt eats 3 meals daily and she provides pt with snacks in between meals; pt does not drink nutritional supplements because they upset her stomach but, she does drink Lactose Free milk.  Weight history shows pt has lost 18% of her body weight in the past year.   Height: Ht Readings from Last 1 Encounters:  07/12/13 5\' 3"  (1.6 m)    Weight: Wt Readings from Last 1 Encounters:  07/12/13 104 lb 6.4 oz (47.356 kg)    Ideal Body Weight: 115 lbs  % Ideal Body Weight: 90%  Wt Readings from Last 10 Encounters:  07/12/13 104 lb 6.4 oz (47.356 kg)  07/12/13 104 lb 6.4 oz (47.356 kg)  02/01/13 115 lb 8 oz (52.39 kg)  06/16/12 127 lb (57.607 kg)    Usual Body Weight: 130 lbs  % Usual Body Weight: 80%  BMI:  Body mass index is 18.5 kg/(m^2).  Estimated Nutritional Needs: Kcal: 1300-1500 Protein: 55-65 grams Fluid: 1.4 L/day  Skin: intact  Diet Order: NPO  EDUCATION  NEEDS: -No education needs identified at this time  No intake or output data in the 24 hours ending 07/12/13 1157  Last BM: PTA  Labs:   Recent Labs Lab 07/11/13 2105 07/12/13 0147  NA 146 144  K 4.0 4.2  CL 105 105  CO2 28 26  BUN 11 10  CREATININE 0.86 0.77  CALCIUM 9.1 8.9  GLUCOSE 139* 123*    CBG (last 3)  No results found for this basename: GLUCAP,  in the last 72 hours  Scheduled Meds: . aspirin EC  81 mg Oral Daily  . donepezil  10 mg Oral Daily  . heparin  5,000 Units Subcutaneous 3 times per day  . levothyroxine  88 mcg Oral QAC breakfast  . Memantine HCl ER  28 mg Oral Daily  . vitamin B-12  1,000 mcg Oral Daily  . [START ON 07/13/2013] Vitamin D (Ergocalciferol)  50,000 Units Oral Q30 days    Continuous Infusions: . sodium chloride 75 mL/hr at 07/12/13 0056    Past Medical History  Diagnosis Date  . Dementia   . Dyslipidemia   . Vitamin D deficiency   . Alzheimer's disease 06/16/2012  . Ankle fracture, left     As a child  . Hiatal hernia     Past Surgical History  Procedure Laterality Date  . Ankle fracture surgery Left   . Hiatal hernia repair N/A   . Cataract extraction Left     Ian Malkineanne Barnett RD,  LDN Inpatient Clinical Dietitian Pager: [phone] After Hours Pager: [phone]

## 2013-07-12 NOTE — Progress Notes (Signed)
Orthopedics Progress Note  Subjective: Patient denies SOB or chest pain Objective:  Filed Vitals:   07/12/13 1740  BP: 114/65  Pulse: 94  Temp: 97.5 F (36.4 C)  Resp: 22    General: Awake and alert  Musculoskeletal: right LE shortened and externally rotated Neurovascularly intact  Lab Results  Component Value Date   WBC 10.3 07/12/2013   HGB 13.1 07/12/2013   HCT 41.1 07/12/2013   MCV 100.0 07/12/2013   PLT 200 07/12/2013       Component Value Date/Time   NA 144 07/12/2013 0147   K 4.2 07/12/2013 0147   CL 105 07/12/2013 0147   CO2 26 07/12/2013 0147   GLUCOSE 123* 07/12/2013 0147   BUN 10 07/12/2013 0147   CREATININE 0.77 07/12/2013 0147   CALCIUM 8.9 07/12/2013 0147   GFRNONAA 73* 07/12/2013 0147   GFRAA 84* 07/12/2013 0147    Lab Results  Component Value Date   INR 1.06 07/12/2013    Assessment/Plan: Right hip femoral neck fracture Events of earlier noted. Patients sats have improved dramatically.  PAtient is alert and looks well and comfortable She is no longer febrile this evening.   I have discussed with the patient and her two daughters and recommended that we proceed with surgery.  They are in agreement.  She is at high surgical risk.   Almedia BallsSteven R. Ranell PatrickNorris, MD 07/12/2013 8:03 PM

## 2013-07-12 NOTE — Progress Notes (Signed)
Pt found with decreased sats of 65% on 3L N/C; increased to 6L. Sats remained in the 70's. Venti-mask at 50% applied. Sats increased to 94%; other vital signs stable. Narcan 0.4mg  given. Rapid response and Dr. Suanne MarkerViyuoh were notified.   Noted pt warm to touch. Obtained rectal temp which was 102.7. Pt more alert. Sats 94% on 50% venti-mask. Dr Suanne MarkerViyuoh paged and updated. Came to see pt. Will continue to monitor.

## 2013-07-12 NOTE — Progress Notes (Signed)
[facility] PROGRESS NOTE  Kathy [name] ZOX:[id number] DOB: 10-Jun-1922 DOA: 07/11/2013 PCP: Junious SilkALTHEIMER,MICHAEL D, MD I have seen and examined pt who is an 80yo admitted this am by Dr Alvester MorinNewton with h/o advanced dementia,dyslipidemia with R. Fem neck fracture s/p mechanical fall. No CP pain reported and she denies any complaints this amexcept for R. Hip pain earlier. Her daughter is at bedside and states there is usually someone with her 24/7. EKG done in ED was neg for acute ischemic findings, will obtain CXR to eval for acute findings, o/w from medicine standpoint her surgical risk is related to her advanced age, and  She is ok to proceed for the needed surgery from medicine standpoint. Continue pain control and other management as per Dr Alvester MorinNewton this am. Appreciate ortho assistance>> surgery planned today.   [name]  [facility] Pager [phone]. If 7PM-7AM, please contact night-coverage at [email or web address], password [email or web address] 07/12/2013, 9:06 AM  LOS: 1 day

## 2013-07-12 NOTE — Anesthesia Preprocedure Evaluation (Deleted)
Anesthesia Evaluation    Reviewed: Allergy & Precautions, H&P , NPO status , Patient's Chart, lab work & pertinent test results  History of Anesthesia Complications Negative for: history of anesthetic complications  Airway       Dental   Pulmonary neg pulmonary ROS,          Cardiovascular negative cardio ROS      Neuro/Psych Dementia negative psych ROS   GI/Hepatic Neg liver ROS, hiatal hernia,   Endo/Other  Hypothyroidism   Renal/GU negative Renal ROS     Musculoskeletal   Abdominal   Peds  Hematology   Anesthesia Other Findings   Reproductive/Obstetrics                           Anesthesia Physical Anesthesia Plan  ASA: III  Anesthesia Plan:    Post-op Pain Management:    Induction: Intravenous  Airway Management Planned:   Additional Equipment:   Intra-op Plan:   Post-operative Plan: Extubation in OR  Informed Consent: I have reviewed the patients History and Physical, chart, labs and discussed the procedure including the risks, benefits and alternatives for the proposed anesthesia with the patient or authorized representative who has indicated his/her understanding and acceptance.   Dental advisory given  Plan Discussed with:   Anesthesia Plan Comments:         Anesthesia Quick Evaluation

## 2013-07-12 NOTE — Consult Note (Signed)
  Patient with right femoral neck fracture, displaced  Will need right hip hemiarthroplasty Pending medical clearance could go to OR tonight NPO today  Consent ordered  Full note to follow

## 2013-07-12 NOTE — Progress Notes (Signed)
ANTICOAGULATION CONSULT NOTE - Initial Consult  Pharmacy Consult for Heparin  Indication: pulmonary embolus  Allergies  Allergen Reactions  . Codeine Other (See Comments)    Unknown     Patient Measurements: Height: 5\' 3"  (160 cm) Weight: 104 lb 6.4 oz (47.356 kg) IBW/kg (Calculated) : 52.4  Vital Signs: Temp: 98.6 F (37 C) (04/30 2137) Temp src: Oral (04/30 2137) BP: 110/55 mmHg (04/30 2137) Pulse Rate: 89 (04/30 2137)  Labs:  Recent Labs  07/11/13 2105 07/12/13 0147 07/12/13 1132 07/12/13 1515  HGB 13.6 13.1  --   --   HCT 42.7 41.1  --   --   PLT 211 200  --   --   LABPROT  --   --  13.6  --   INR  --   --  1.06  --   CREATININE 0.86 0.77  --   --   TROPONINI  --   --   --  <0.30    Estimated Creatinine Clearance: 36.4 ml/min (by C-G formula based on Cr of 0.77).   Medical History: Past Medical History  Diagnosis Date  . Dementia   . Dyslipidemia   . Vitamin D deficiency   . Alzheimer's disease 06/16/2012  . Ankle fracture, left     As a child  . Hiatal hernia    Assessment: 80 y/o F s/p right hip fracture after fall, having some desaturations, found to have elevated D-Dimer, CT Angio is + for small bilateral PE's. CBC is good, renal function appropriate for age, other labs as above.   Goal of Therapy:  Heparin level 0.3-0.7 units/ml Monitor platelets by anticoagulation protocol: Yes   Plan:  -Heparin 2500 units BOLUS -Start heparin drip at 700 units/hr -0800 HL -Daily CBC/HL -Monitor for bleeding  Abran DukeJames Adekunle Rohrbach 07/12/2013,11:26 PM

## 2013-07-12 NOTE — H&P (Signed)
Hospitalist Admission History and Physical  Patient name: Kathy [name] Medical record number: [id number] Date of birth: 11/25/1922 Age: 80 y.o. Gender: female  Primary Care Provider: Junious SilkALTHEIMER,MICHAEL D, MD  Chief Complaint: mechanical fall, R femoral neck fracture  History of Present Illness:This is a 80 y.o. year old female with prior hx/o end stage dementia, HLD presenting with R femoral neck fracture s/p mechanical fall. Primary history is from daughter. Daughter states that pt was getting out of bed when she slipped, accidentally falling on R hip. Pt had severe pain after fall.   Presented to ER with pain. Had R hip xray that showed displaced R femoral neck fracture. Ortho consulted in ER. Plan for surgical repair in am. Medical admission and pain control in the interim.   Patient Active Problem List   Diagnosis Date Noted  . Femoral neck fracture 07/12/2013  . Femur fracture, right 07/11/2013  . Memory loss 06/16/2012  . Alzheimer's disease 06/16/2012   Past Medical History: Past Medical History  Diagnosis Date  . Dementia   . Dyslipidemia   . Vitamin D deficiency   . Alzheimer's disease 06/16/2012  . Ankle fracture, left     As a child  . Hiatal hernia     Past Surgical History: Past Surgical History  Procedure Laterality Date  . Ankle fracture surgery Left   . Hiatal hernia repair N/A   . Cataract extraction Left     Social History: History   Social History  . Marital Status: Widowed    Spouse Name: N/A    Number of Children: N/A  . Years of Education: N/A   Social History Main Topics  . Smoking status: Never Smoker   . Smokeless tobacco: None  . Alcohol Use: No  . Drug Use: No  . Sexual Activity: None   Other Topics Concern  . None   Social History Narrative  . None    Family History: Family History  Problem Relation Age of Onset  . Heart disease Mother   . Cancer Father   . Heart disease Sister     Allergies: Allergies  Allergen  Reactions  . Codeine Other (See Comments)    Unknown     Current Facility-Administered Medications  Medication Dose Route Frequency Provider Last Rate Last Dose  . 0.9 %  sodium chloride infusion   Intravenous Continuous Doree AlbeeSteven Vanity Larsson, MD      . aspirin EC tablet 81 mg  81 mg Oral Daily Doree AlbeeSteven Hansika Leaming, MD      . donepezil (ARICEPT) tablet 10 mg  10 mg Oral Daily Doree AlbeeSteven Jguadalupe Opiela, MD      . ergocalciferol (VITAMIN D2) capsule 50,000 Units  50,000 Units Oral Q30 days Doree AlbeeSteven Ottis Sarnowski, MD      . heparin injection 5,000 Units  5,000 Units Subcutaneous 3 times per day Doree AlbeeSteven Argie Applegate, MD      . HYDROmorphone (DILAUDID) injection 0.5 mg  0.5 mg Intravenous Q2H PRN Doree AlbeeSteven Masen Salvas, MD      . levothyroxine (SYNTHROID, LEVOTHROID) tablet 88 mcg  88 mcg Oral QAC breakfast Doree AlbeeSteven Martine Bleecker, MD      . Memantine HCl ER CP24 28 mg  28 mg Oral Daily Doree AlbeeSteven Adriahna Shearman, MD      . vitamin B-12 (CYANOCOBALAMIN) tablet 1,000 mcg  1,000 mcg Oral Daily Doree AlbeeSteven Kennet Mccort, MD       Current Outpatient Prescriptions  Medication Sig Dispense Refill  . aspirin EC 81 MG tablet Take 81 mg by mouth daily.      .Marland Kitchen  donepezil (ARICEPT) 10 MG tablet Take 1 tablet (10 mg total) by mouth daily.  90 tablet  3  . ergocalciferol (VITAMIN D2) 50000 UNITS capsule Take 50,000 Units by mouth every 30 (thirty) days.       Marland Kitchen. levothyroxine (SYNTHROID, LEVOTHROID) 88 MCG tablet Take 88 mcg by mouth daily before breakfast.      . Memantine HCl ER (NAMENDA XR) 28 MG CP24 Take 28 mg by mouth daily.  90 capsule  3  . vitamin B-12 (CYANOCOBALAMIN) 1000 MCG tablet Take 1,000 mcg by mouth daily.       Review Of Systems: 12 point ROS negative except as noted above in HPI.  Physical Exam: Filed Vitals:   07/11/13 2351  BP: 118/62  Pulse:   Temp:   Resp: 18    General: alert and pleasantly confused HEENT: PERRLA and extra ocular movement intact Heart: S1, S2 normal, no murmur, rub or gallop, regular rate and rhythm Lungs: clear to auscultation, no wheezes  or rales and unlabored breathing Abdomen: abdomen is soft without significant tenderness, masses, organomegaly or guarding Extremities: R groin pain, neurovascularly intact distally  Skin:no rashes, no ecchymoses Neurology: normal without focal findings  Labs and Imaging: Lab Results  Component Value Date/Time   NA 146 07/11/2013  9:05 PM   K 4.0 07/11/2013  9:05 PM   CL 105 07/11/2013  9:05 PM   CO2 28 07/11/2013  9:05 PM   BUN 11 07/11/2013  9:05 PM   CREATININE 0.86 07/11/2013  9:05 PM   GLUCOSE 139* 07/11/2013  9:05 PM   Lab Results  Component Value Date   WBC 11.1* 07/11/2013   HGB 13.6 07/11/2013   HCT 42.7 07/11/2013   MCV 99.8 07/11/2013   PLT 211 07/11/2013    Dg Hip Complete Right  07/11/2013   CLINICAL DATA:  Right hip pain after fall.  EXAM: RIGHT HIP - COMPLETE 2+ VIEW  COMPARISON:  None.  FINDINGS: Moderately displaced fracture is seen involving the right proximal femoral neck. Femoral head is well situated within the acetabulum.  IMPRESSION: Moderately displaced right proximal femoral neck fracture.   Electronically Signed   By: Roque LiasJames  Green M.D.   On: 07/11/2013 20:56     Assessment and Plan: Kathy [name] is a 80 y.o. year old female presenting with mechanical fall, R femoral neck fracture.   R femoral neck fracture: Ortho consulted. Formal recs pending. NPO pending possible OR today. Pain control. Continue to follow.   Dementia: end stage clinically. No acute delirium. Continue home regimen.   HypoTSH: continue synthroid.   FEN/GI: NPO PMN  Prophylaxis: sub q heparin  Disposition: pending further evaluation  Code Status:Full Code.         Doree AlbeeSteven Aquarius Tremper MD  Pager: [phone]

## 2013-07-12 NOTE — Clinical Social Work Note (Signed)
Clinical Social Worker met with patient daughter at bedside to discuss SNF options.  Patient family agreeable.  Full assessment to follow.  Barbette Or, Branch

## 2013-07-12 NOTE — Progress Notes (Addendum)
[facility] progress note. Chief complaint. Desats. History of present illness. This 80 year old female in hospital with a right hip fracture status post fall. She had a incidence of desaturation earlier today. Narcan was given and patient placed on 50% Ventimask with O2 sats in the mid 90s. Staff to this point has been unable to wean patient from Ventimask to nasal cannula room air. She had a chest x-ray on admission which did not look suggestive for either congestive heart failure or infiltrate. D-dimer was obtained and this resulted elevated 5.53. The patient was ordered to have a CT angiogram of the chest and this has been completed but results are still pending. I came to the bedside to evaluate the patient to ensure she remains clinically stable. Patient is very demented and a poor historian thus review of systems is really impossible to obtain. Per the patient's daughter at bedside and has been no cough or purulent appearing sputum. Vital signs. Temperature 98.6, pulse 9, respiration 20, blood pressure 110/55. O2 sats 95%. General appearance. Frail appearing elderly female who is alert but does not follow commands well. She is clearly suffering from advanced dementia. Cardiac. Rate and rhythm regular. No jugular venous distention. There is some nonpitting edema left leg were none is evident on the right. She does appear to have some calf pain in her Denna HaggardHomans does appear positive. Lungs. Breath sounds are clear but somewhat reduced in the right base. No distress or tachypnea evident. O2 sats are stable. Abdomen. Soft with positive bowel sounds. No pain. Extremities. As above. Impression/plan. Problem #1. Desaturations. Patient appears clinically stable per bedside evaluation. No clinical evidence of respiratory tract infection congestive heart failure. Her chest x-ray was clear earlier. D-dimer elevated and CT angiogram of the chest result is pending. We'll follow for this and introduce full dose  heparin if appropriate.  Addendum: Patient's CT angiogram of the chest has returned positive for pulmonary emboli and reads as small subsegmental pulmonary embolus within the pulmonary arterial branches of the left lingula and right lower lobe. I would discontinue subcutaneous heparin and start full dose heparin per pharmacy consult. Nursing will update me of any changes in patient's respiratory status.

## 2013-07-13 ENCOUNTER — Encounter (HOSPITAL_COMMUNITY): Payer: Self-pay | Admitting: Radiology

## 2013-07-13 ENCOUNTER — Inpatient Hospital Stay (HOSPITAL_COMMUNITY): Payer: Medicare HMO

## 2013-07-13 DIAGNOSIS — Z515 Encounter for palliative care: Secondary | ICD-10-CM

## 2013-07-13 DIAGNOSIS — R413 Other amnesia: Secondary | ICD-10-CM

## 2013-07-13 DIAGNOSIS — I2699 Other pulmonary embolism without acute cor pulmonale: Secondary | ICD-10-CM

## 2013-07-13 LAB — COMPREHENSIVE METABOLIC PANEL
ALBUMIN: 2.8 g/dL — AB (ref 3.5–5.2)
ALT: 9 U/L (ref 0–35)
AST: 13 U/L (ref 0–37)
Alkaline Phosphatase: 88 U/L (ref 39–117)
BUN: 13 mg/dL (ref 6–23)
CHLORIDE: 105 meq/L (ref 96–112)
CO2: 29 mEq/L (ref 19–32)
Calcium: 8.5 mg/dL (ref 8.4–10.5)
Creatinine, Ser: 0.83 mg/dL (ref 0.50–1.10)
GFR calc Af Amer: 71 mL/min — ABNORMAL LOW (ref >90)
GFR, EST NON AFRICAN AMERICAN: 61 mL/min — AB (ref >90)
Glucose, Bld: 116 mg/dL — ABNORMAL HIGH (ref 70–99)
Potassium: 4 mEq/L (ref 3.7–5.3)
Sodium: 144 mEq/L (ref 137–147)
Total Bilirubin: 1.1 mg/dL (ref 0.3–1.2)
Total Protein: 6 g/dL (ref 6.0–8.3)

## 2013-07-13 LAB — URINE CULTURE
COLONY COUNT: NO GROWTH
Culture: NO GROWTH

## 2013-07-13 LAB — CBC WITH DIFFERENTIAL/PLATELET
Basophils Absolute: 0 10*3/uL (ref 0.0–0.1)
Basophils Relative: 0 % (ref 0–1)
Eosinophils Absolute: 0.1 10*3/uL (ref 0.0–0.7)
Eosinophils Relative: 1 % (ref 0–5)
HCT: 41.3 % (ref 36.0–46.0)
Hemoglobin: 13 g/dL (ref 12.0–15.0)
LYMPHS ABS: 2 10*3/uL (ref 0.7–4.0)
LYMPHS PCT: 21 % (ref 12–46)
MCH: 31.9 pg (ref 26.0–34.0)
MCHC: 31.5 g/dL (ref 30.0–36.0)
MCV: 101.2 fL — ABNORMAL HIGH (ref 78.0–100.0)
Monocytes Absolute: 0.8 10*3/uL (ref 0.1–1.0)
Monocytes Relative: 8 % (ref 3–12)
NEUTROS ABS: 6.5 10*3/uL (ref 1.7–7.7)
NEUTROS PCT: 70 % (ref 43–77)
PLATELETS: 184 10*3/uL (ref 150–400)
RBC: 4.08 MIL/uL (ref 3.87–5.11)
RDW: 14.8 % (ref 11.5–15.5)
WBC: 9.3 10*3/uL (ref 4.0–10.5)

## 2013-07-13 LAB — CBC
HCT: 41.7 % (ref 36.0–46.0)
HEMOGLOBIN: 13 g/dL (ref 12.0–15.0)
MCH: 31.6 pg (ref 26.0–34.0)
MCHC: 31.2 g/dL (ref 30.0–36.0)
MCV: 101.5 fL — ABNORMAL HIGH (ref 78.0–100.0)
Platelets: 169 10*3/uL (ref 150–400)
RBC: 4.11 MIL/uL (ref 3.87–5.11)
RDW: 14.7 % (ref 11.5–15.5)
WBC: 9.7 10*3/uL (ref 4.0–10.5)

## 2013-07-13 LAB — HEPARIN LEVEL (UNFRACTIONATED): HEPARIN UNFRACTIONATED: 0.27 [IU]/mL — AB (ref 0.30–0.70)

## 2013-07-13 LAB — TROPONIN I

## 2013-07-13 MED ORDER — HEPARIN (PORCINE) IN NACL 100-0.45 UNIT/ML-% IJ SOLN
800.0000 [IU]/h | INTRAMUSCULAR | Status: DC
  Administered 2013-07-13: 800 [IU]/h via INTRAVENOUS

## 2013-07-13 MED ORDER — MIDAZOLAM HCL 2 MG/2ML IJ SOLN
INTRAMUSCULAR | Status: AC
  Filled 2013-07-13: qty 2

## 2013-07-13 MED ORDER — MIDAZOLAM HCL 2 MG/2ML IJ SOLN
INTRAMUSCULAR | Status: AC | PRN
  Administered 2013-07-13: 1 mg via INTRAVENOUS

## 2013-07-13 MED ORDER — FENTANYL CITRATE 0.05 MG/ML IJ SOLN
INTRAMUSCULAR | Status: AC | PRN
  Administered 2013-07-13 (×2): 12.5 ug via INTRAVENOUS

## 2013-07-13 MED ORDER — FENTANYL CITRATE 0.05 MG/ML IJ SOLN
INTRAMUSCULAR | Status: AC
  Filled 2013-07-13: qty 2

## 2013-07-13 MED ORDER — IOHEXOL 300 MG/ML  SOLN
100.0000 mL | Freq: Once | INTRAMUSCULAR | Status: AC | PRN
  Administered 2013-07-13: 40 mL via INTRAVENOUS

## 2013-07-13 NOTE — Clinical Documentation Improvement (Signed)
Possible Clinical Conditions?   Severe Malnutrition   Protein Calorie Malnutrition Severe Protein Calorie Malnutrition Emaciation  Cachexia   Other Condition Cannot clinically determine   Risk Factors: (per RD's 07/12/13 evaluation): Inadequate oral intake related to dementia and decreased appetite as evidenced by 18% weight loss in the past year. BMI is 18.5.   Treatment: When physician advances diet, recommend Magic cup BID and snacks BID.  Thank You, Marciano SequinWanda Mathews-Bethea,RN,BSN, Clinical Documentation Specialist:  [phone]  [facility]- Health Information Management

## 2013-07-13 NOTE — H&P (Signed)
Agree with PA note.  Given pt's age and long term risk for anticoagulation, her IVC filter will likely be a permanent device.   Signed,  Sterling BigHeath K. Tal Neer, MD [facility]

## 2013-07-13 NOTE — Progress Notes (Signed)
UR complete.  Uno Esau RN, MSN 

## 2013-07-13 NOTE — Progress Notes (Signed)
   Subjective: Pt lying comfortably with venturi mask to aid with O2 sats Bilateral lung PEs seen last night on CT scan prior to surgery Will defer to medicine team in regards to timing of hip surgery due to recent diagnosis of PE and full dose heparin Will keep pt NPO in case she is cleared for surgery later today Currently pt comfortable pain wise with family at bedside  Patient reports pain as moderate.  Objective:   VITALS:   Filed Vitals:   07/13/13 0606  BP: 128/51  Pulse: 70  Temp: 98 F (36.7 C)  Resp: 20    Right lower extremity with shortening and mild external rotation nv intact distally No rashes or edema  LABS  Recent Labs  07/11/13 2105 07/12/13 0147 07/13/13 0500  HGB 13.6 13.1 13.0  HCT 42.7 41.1 41.3  WBC 11.1* 10.3 9.3  PLT 211 200 184     Recent Labs  07/11/13 2105 07/12/13 0147 07/13/13 0500  NA 146 144 144  K 4.0 4.2 4.0  BUN 11 10 13   CREATININE 0.86 0.77 0.83  GLUCOSE 139* 123* 116*     Assessment/Plan: Right femur fracture  Will defer to medicine team in regards to new diagnosis of bilateral PE and full dose heparin Keep NPO in case she is cleared for surgery later today Family at bedside and informed of plan Will continue to monitor her progress throughout the day     Alphonsa OverallBrad Gwendolen Hewlett, MPAS, PA-C  07/13/2013, 7:41 AM

## 2013-07-13 NOTE — H&P (Signed)
Kathy [name] is an 80 y.o. female.   Chief Complaint: Pt fell at home 4/29 and has fractured Rt hip + dementia During work up CT revealed subsegmental B PE  Ortho has seen pt and will move ahead with surgery soon Now on Heparin drip But not candidate for long term anticoagulation secondary to impending surgery and dementia/fall risk Scheduled now for retrievable inferior vena cava filter placement No existing doppler studies  HPI: demtentia; HLD; Alzhiemer  Past Medical History  Diagnosis Date  . Dementia   . Dyslipidemia   . Vitamin D deficiency   . Alzheimer's disease 06/16/2012  . Ankle fracture, left     As a child  . Hiatal hernia     Past Surgical History  Procedure Laterality Date  . Ankle fracture surgery Left   . Hiatal hernia repair N/A   . Cataract extraction Left     Family History  Problem Relation Age of Onset  . Heart disease Mother   . Cancer Father   . Heart disease Sister    Social History:  reports that she has never smoked. She does not have any smokeless tobacco history on file. She reports that she does not drink alcohol or use illicit drugs.  Allergies:  Allergies  Allergen Reactions  . Codeine Other (See Comments)    Unknown     Medications Prior to Admission  Medication Sig Dispense Refill  . aspirin EC 81 MG tablet Take 81 mg by mouth daily.      Marland Kitchen donepezil (ARICEPT) 10 MG tablet Take 1 tablet (10 mg total) by mouth daily.  90 tablet  3  . ergocalciferol (VITAMIN D2) 50000 UNITS capsule Take 50,000 Units by mouth every 30 (thirty) days.       Marland Kitchen levothyroxine (SYNTHROID, LEVOTHROID) 88 MCG tablet Take 88 mcg by mouth daily before breakfast.      . Memantine HCl ER (NAMENDA XR) 28 MG CP24 Take 28 mg by mouth daily.  90 capsule  3  . vitamin B-12 (CYANOCOBALAMIN) 1000 MCG tablet Take 1,000 mcg by mouth daily.        Results for orders placed during the hospital encounter of 07/11/13 (from the past 48 hour(s))  CBC     Status: Abnormal    Collection Time    07/11/13  9:05 PM      Result Value Ref Range   WBC 11.1 (*) 4.0 - 10.5 K/uL   RBC 4.28  3.87 - 5.11 MIL/uL   Hemoglobin 13.6  12.0 - 15.0 g/dL   HCT 42.7  36.0 - 46.0 %   MCV 99.8  78.0 - 100.0 fL   MCH 31.8  26.0 - 34.0 pg   MCHC 31.9  30.0 - 36.0 g/dL   RDW 14.6  11.5 - 15.5 %   Platelets 211  150 - 400 K/uL  BASIC METABOLIC PANEL     Status: Abnormal   Collection Time    07/11/13  9:05 PM      Result Value Ref Range   Sodium 146  137 - 147 mEq/L   Potassium 4.0  3.7 - 5.3 mEq/L   Chloride 105  96 - 112 mEq/L   CO2 28  19 - 32 mEq/L   Glucose, Bld 139 (*) 70 - 99 mg/dL   BUN 11  6 - 23 mg/dL   Creatinine, Ser 0.86  0.50 - 1.10 mg/dL   Calcium 9.1  8.4 - 10.5 mg/dL   GFR calc non  Af Amer 59 (*) >90 mL/min   GFR calc Af Amer 68 (*) >90 mL/min   Comment: (NOTE)     The eGFR has been calculated using the CKD EPI equation.     This calculation has not been validated in all clinical situations.     eGFR's persistently <90 mL/min signify possible Chronic Kidney     Disease.  COMPREHENSIVE METABOLIC PANEL     Status: Abnormal   Collection Time    07/12/13  1:47 AM      Result Value Ref Range   Sodium 144  137 - 147 mEq/L   Potassium 4.2  3.7 - 5.3 mEq/L   Chloride 105  96 - 112 mEq/L   CO2 26  19 - 32 mEq/L   Glucose, Bld 123 (*) 70 - 99 mg/dL   BUN 10  6 - 23 mg/dL   Creatinine, Ser 0.77  0.50 - 1.10 mg/dL   Calcium 8.9  8.4 - 10.5 mg/dL   Total Protein 6.5  6.0 - 8.3 g/dL   Albumin 3.1 (*) 3.5 - 5.2 g/dL   AST 15  0 - 37 U/L   ALT 10  0 - 35 U/L   Alkaline Phosphatase 97  39 - 117 U/L   Total Bilirubin 0.7  0.3 - 1.2 mg/dL   GFR calc non Af Amer 73 (*) >90 mL/min   GFR calc Af Amer 84 (*) >90 mL/min   Comment: (NOTE)     The eGFR has been calculated using the CKD EPI equation.     This calculation has not been validated in all clinical situations.     eGFR's persistently <90 mL/min signify possible Chronic Kidney     Disease.  CBC WITH  DIFFERENTIAL     Status: Abnormal   Collection Time    07/12/13  1:47 AM      Result Value Ref Range   WBC 10.3  4.0 - 10.5 K/uL   RBC 4.11  3.87 - 5.11 MIL/uL   Hemoglobin 13.1  12.0 - 15.0 g/dL   HCT 41.1  36.0 - 46.0 %   MCV 100.0  78.0 - 100.0 fL   MCH 31.9  26.0 - 34.0 pg   MCHC 31.9  30.0 - 36.0 g/dL   RDW 14.5  11.5 - 15.5 %   Platelets 200  150 - 400 K/uL   Neutrophils Relative % 78 (*) 43 - 77 %   Neutro Abs 8.0 (*) 1.7 - 7.7 K/uL   Lymphocytes Relative 15  12 - 46 %   Lymphs Abs 1.5  0.7 - 4.0 K/uL   Monocytes Relative 7  3 - 12 %   Monocytes Absolute 0.8  0.1 - 1.0 K/uL   Eosinophils Relative 0  0 - 5 %   Eosinophils Absolute 0.0  0.0 - 0.7 K/uL   Basophils Relative 0  0 - 1 %   Basophils Absolute 0.0  0.0 - 0.1 K/uL  HEMOGLOBIN A1C     Status: Abnormal   Collection Time    07/12/13  1:47 AM      Result Value Ref Range   Hemoglobin A1C 5.7 (*) <5.7 %   Comment: (NOTE)  According to the ADA Clinical Practice Recommendations for 2011, when     HbA1c is used as a screening test:      >=6.5%   Diagnostic of Diabetes Mellitus               (if abnormal result is confirmed)     5.7-6.4%   Increased risk of developing Diabetes Mellitus     References:Diagnosis and Classification of Diabetes Mellitus,Diabetes     LKJZ,7915,05(WPVXY 1):S62-S69 and Standards of Medical Care in             Diabetes - 2011,Diabetes IAXK,5537,48 (Suppl 1):S11-S61.   Mean Plasma Glucose 117 (*) <117 mg/dL   Comment: Performed at [place]     Status: None   Collection Time    07/12/13 11:32 AM      Result Value Ref Range   Prothrombin Time 13.6  11.6 - 15.2 seconds   INR 1.06  0.00 - 1.49  TROPONIN I     Status: None   Collection Time    07/12/13  3:15 PM      Result Value Ref Range   Troponin I <0.30  <0.30 ng/mL   Comment:            Due to the release kinetics of cTnI,     a  negative result within the first hours     of the onset of symptoms does not rule out     myocardial infarction with certainty.     If myocardial infarction is still suspected,     repeat the test at appropriate intervals.  D-DIMER, QUANTITATIVE     Status: Abnormal   Collection Time    07/12/13  3:15 PM      Result Value Ref Range   D-Dimer, Quant 5.53 (*) 0.00 - 0.48 ug/mL-FEU   Comment:            AT THE INHOUSE ESTABLISHED CUTOFF     VALUE OF 0.48 ug/mL FEU,     THIS ASSAY HAS BEEN DOCUMENTED     IN THE LITERATURE TO HAVE     A SENSITIVITY AND NEGATIVE     PREDICTIVE VALUE OF AT LEAST     98 TO 99%.  THE TEST RESULT     SHOULD BE CORRELATED WITH     AN ASSESSMENT OF THE CLINICAL     PROBABILITY OF DVT / VTE.  URINALYSIS, ROUTINE W REFLEX MICROSCOPIC     Status: Abnormal   Collection Time    07/12/13  4:13 PM      Result Value Ref Range   Color, Urine YELLOW  YELLOW   APPearance CLOUDY (*) CLEAR   Specific Gravity, Urine 1.017  1.005 - 1.030   pH 6.0  5.0 - 8.0   Glucose, UA NEGATIVE  NEGATIVE mg/dL   Hgb urine dipstick NEGATIVE  NEGATIVE   Bilirubin Urine NEGATIVE  NEGATIVE   Ketones, ur NEGATIVE  NEGATIVE mg/dL   Protein, ur NEGATIVE  NEGATIVE mg/dL   Urobilinogen, UA 1.0  0.0 - 1.0 mg/dL   Nitrite NEGATIVE  NEGATIVE   Leukocytes, UA NEGATIVE  NEGATIVE   Comment: MICROSCOPIC NOT DONE ON URINES WITH NEGATIVE PROTEIN, BLOOD, LEUKOCYTES, NITRITE, OR GLUCOSE <1000 mg/dL.  CULTURE, BLOOD (ROUTINE X 2)     Status: None   Collection Time    07/12/13  4:26 PM      Result Value Ref Range   Specimen Description BLOOD RIGHT HAND  Special Requests BOTTLES DRAWN AEROBIC ONLY 3CC     Culture  Setup Time       Value: 07/12/2013 20:55     Performed at [facility]   Culture       Value:        BLOOD CULTURE RECEIVED NO GROWTH TO DATE CULTURE WILL BE HELD FOR 5 DAYS BEFORE ISSUING A FINAL NEGATIVE REPORT     Performed at [facility]   Report Status PENDING     CULTURE, BLOOD (ROUTINE X 2)     Status: None   Collection Time    07/12/13  4:40 PM      Result Value Ref Range   Specimen Description BLOOD LEFT HAND     Special Requests BOTTLES DRAWN AEROBIC ONLY 4CC     Culture  Setup Time       Value: 07/12/2013 20:55     Performed at [facility]   Culture       Value:        BLOOD CULTURE RECEIVED NO GROWTH TO DATE CULTURE WILL BE HELD FOR 5 DAYS BEFORE ISSUING A FINAL NEGATIVE REPORT     Performed at [facility]   Report Status PENDING    TROPONIN I     Status: None   Collection Time    07/12/13 11:00 PM      Result Value Ref Range   Troponin I <0.30  <0.30 ng/mL   Comment:            Due to the release kinetics of cTnI,     a negative result within the first hours     of the onset of symptoms does not rule out     myocardial infarction with certainty.     If myocardial infarction is still suspected,     repeat the test at appropriate intervals.  TROPONIN I     Status: None   Collection Time    07/13/13  5:00 AM      Result Value Ref Range   Troponin I <0.30  <0.30 ng/mL   Comment:            Due to the release kinetics of cTnI,     a negative result within the first hours     of the onset of symptoms does not rule out     myocardial infarction with certainty.     If myocardial infarction is still suspected,     repeat the test at appropriate intervals.  COMPREHENSIVE METABOLIC PANEL     Status: Abnormal   Collection Time    07/13/13  5:00 AM      Result Value Ref Range   Sodium 144  137 - 147 mEq/L   Potassium 4.0  3.7 - 5.3 mEq/L   Chloride 105  96 - 112 mEq/L   CO2 29  19 - 32 mEq/L   Glucose, Bld 116 (*) 70 - 99 mg/dL   BUN 13  6 - 23 mg/dL   Creatinine, Ser 0.83  0.50 - 1.10 mg/dL   Calcium 8.5  8.4 - 10.5 mg/dL   Total Protein 6.0  6.0 - 8.3 g/dL   Albumin 2.8 (*) 3.5 - 5.2 g/dL   AST 13  0 - 37 U/L   ALT 9  0 - 35 U/L   Alkaline Phosphatase 88  39 - 117 U/L   Total Bilirubin 1.1  0.3 - 1.2 mg/dL    GFR  calc non Af Amer 61 (*) >90 mL/min   GFR calc Af Amer 71 (*) >90 mL/min   Comment: (NOTE)     The eGFR has been calculated using the CKD EPI equation.     This calculation has not been validated in all clinical situations.     eGFR's persistently <90 mL/min signify possible Chronic Kidney     Disease.  CBC WITH DIFFERENTIAL     Status: Abnormal   Collection Time    07/13/13  5:00 AM      Result Value Ref Range   WBC 9.3  4.0 - 10.5 K/uL   RBC 4.08  3.87 - 5.11 MIL/uL   Hemoglobin 13.0  12.0 - 15.0 g/dL   HCT 41.3  36.0 - 46.0 %   MCV 101.2 (*) 78.0 - 100.0 fL   MCH 31.9  26.0 - 34.0 pg   MCHC 31.5  30.0 - 36.0 g/dL   RDW 14.8  11.5 - 15.5 %   Platelets 184  150 - 400 K/uL   Neutrophils Relative % 70  43 - 77 %   Neutro Abs 6.5  1.7 - 7.7 K/uL   Lymphocytes Relative 21  12 - 46 %   Lymphs Abs 2.0  0.7 - 4.0 K/uL   Monocytes Relative 8  3 - 12 %   Monocytes Absolute 0.8  0.1 - 1.0 K/uL   Eosinophils Relative 1  0 - 5 %   Eosinophils Absolute 0.1  0.0 - 0.7 K/uL   Basophils Relative 0  0 - 1 %   Basophils Absolute 0.0  0.0 - 0.1 K/uL  HEPARIN LEVEL (UNFRACTIONATED)     Status: Abnormal   Collection Time    07/13/13  8:40 AM      Result Value Ref Range   Heparin Unfractionated 0.27 (*) 0.30 - 0.70 IU/mL   Comment:            IF HEPARIN RESULTS ARE BELOW     EXPECTED VALUES, AND PATIENT     DOSAGE HAS BEEN CONFIRMED,     SUGGEST FOLLOW UP TESTING     OF ANTITHROMBIN III LEVELS.  CBC     Status: Abnormal   Collection Time    07/13/13  8:40 AM      Result Value Ref Range   WBC 9.7  4.0 - 10.5 K/uL   RBC 4.11  3.87 - 5.11 MIL/uL   Hemoglobin 13.0  12.0 - 15.0 g/dL   HCT 41.7  36.0 - 46.0 %   MCV 101.5 (*) 78.0 - 100.0 fL   MCH 31.6  26.0 - 34.0 pg   MCHC 31.2  30.0 - 36.0 g/dL   RDW 14.7  11.5 - 15.5 %   Platelets 169  150 - 400 K/uL   Dg Hip Complete Right  07/11/2013   CLINICAL DATA:  Right hip pain after fall.  EXAM: RIGHT HIP - COMPLETE 2+ VIEW  COMPARISON:   None.  FINDINGS: Moderately displaced fracture is seen involving the right proximal femoral neck. Femoral head is well situated within the acetabulum.  IMPRESSION: Moderately displaced right proximal femoral neck fracture.   Electronically Signed   By: Sabino Dick M.D.   On: 07/11/2013 20:56   Ct Angio Chest Pe W/cm &/or Wo Cm  07/12/2013   CLINICAL DATA:  Shortness of breath.  Concern for pulmonary embolus.  EXAM: CT ANGIOGRAPHY CHEST WITH CONTRAST  TECHNIQUE: Multidetector CT imaging of the chest was performed using the  standard protocol during bolus administration of intravenous contrast. Multiplanar CT image reconstructions and MIPs were obtained to evaluate the vascular anatomy.  CONTRAST:  135m OMNIPAQUE IOHEXOL 350 MG/ML SOLN  COMPARISON:  Chest radiograph performed earlier today at 10:17 a.m.  FINDINGS: There appears to be subsegmental pulmonary embolus within pulmonary arterial branches to the left lingula and right lower lobe.  Trace bilateral pleural effusions are noted, with bibasilar atelectasis. There is no evidence of pneumothorax. No masses are identified; no abnormal focal contrast enhancement is seen.  The esophagus is diffusely dilated, with opacification of the mid to distal esophagus, and diffuse wall thickening at the distal esophagus. An underlying mass cannot be excluded. Alternatively, this may reflect prior esophageal dilatation.  Scattered calcification is seen along the aortic arch and descending thoracic aorta. Scattered coronary artery calcifications are seen. The mediastinum is otherwise unremarkable in appearance. No mediastinal lymphadenopathy is seen. No pericardial effusion is identified. No axillary lymphadenopathy is seen. The thyroid gland is not well seen.  The visualized portions of the liver and spleen are unremarkable.  No acute osseous abnormalities are seen. There is mild chronic compression deformity involving vertebral body T10.  Review of the MIP images confirms  the above findings.  IMPRESSION: 1. Small subsegmental pulmonary embolus within pulmonary arterial branches to the left lingula and right lower lobe. 2. Trace bilateral pleural effusions, with bibasilar atelectasis. 3. Diffuse esophageal dilatation, with opacification of the mid to distal esophagus, and diffuse wall thickening of the distal esophagus. An underlying mass cannot be excluded. Alternatively, this may reflect prior esophageal dilatation. Would correlate with prior clinical history and consider endoscopy for further evaluation. 4. Scattered coronary artery calcifications seen. 5. Scattered calcification along the aortic arch and descending thoracic aorta. 6. Mild chronic compression deformity involving vertebral body T10.  Critical Value/emergent results were called by telephone at the time of interpretation on 07/12/2013 at 11:15 PM to [facility] who verbally acknowledged these results.   Electronically Signed   By: JGarald BaldingM.D.   On: 07/12/2013 23:19   Dg Chest Port 1 View  07/12/2013   CLINICAL DATA:  Preop right leg fracture  EXAM: PORTABLE CHEST - 1 VIEW  COMPARISON:  DG CHEST 2 VIEW dated 12/21/2008; DG HIP COMPLETE*R* dated 07/11/2013  FINDINGS: Normal cardiac silhouette with ectatic aorta. Lungs are hyperinflated with bronchitic markings. No effusion, infiltrate, or pneumothorax.  IMPRESSION: 1.  No acute cardiopulmonary process. 2. Hyperinflated lungs.   Electronically Signed   By: SSuzy BouchardM.D.   On: 07/12/2013 10:46    Review of Systems  Constitutional: Negative for fever.  Respiratory: Negative for cough and shortness of breath.   Cardiovascular: Negative for chest pain.  Gastrointestinal: Negative for nausea and vomiting.  Neurological: Positive for weakness. Negative for dizziness.  Psychiatric/Behavioral: Positive for memory loss.    Blood pressure 113/57, pulse 69, temperature 98.9 F (37.2 C), temperature source Oral, resp. rate 20, height 5'  3" (1.6 m), weight 47.356 kg (104 lb 6.4 oz), SpO2 99.00%. Physical Exam  Constitutional: She appears well-developed.  Cardiovascular: Normal rate.   No murmur heard. Respiratory: Effort normal and breath sounds normal. She has no wheezes.  GI: Soft. Bowel sounds are normal. There is no tenderness.  Musculoskeletal: Normal range of motion.  Neurological:  Pleasantly confused  Skin: Skin is warm and dry.  Psychiatric:  Consented dtr in room     Assessment/Plan New B PE Rt hip fx- fall at home 4/29 Now  on Hep drip but surgery for hip is planned soon Cannot long term anticoagulate secondary surgery and dementia/fall risk Scheduled now for Retrievable IVC filter pts dtr aware of procedure benefits and risks and agreeable to proceed Consent signed and in chart  Lavonia Drafts 07/13/2013, 11:17 AM

## 2013-07-13 NOTE — Clinical Social Work Placement (Addendum)
Clinical Social Work Department CLINICAL SOCIAL WORK PLACEMENT NOTE 07/19/2013  Patient:  Kathy CraftsLAMARR,Kathy [name]  Account Number:  [id number] Admit date:  07/11/2013  Clinical Social Worker:  Macario GoldsJESSE SCINTO, LCSW  Date/time:  07/12/2013 12:15 PM  Clinical Social Work is seeking post-discharge placement for this patient at the following level of care:   SKILLED NURSING   (*CSW will update this form in Epic as items are completed)   07/12/2013  Patient/family provided with [facility] of Clinical Social Work's list of facilities offering this level of care within the geographic area requested by the patient (or if unable, by the patient's family).  07/12/2013  Patient/family informed of their freedom to choose among providers that offer the needed level of care, that participate in Medicare, Medicaid or managed care program needed by the patient, have an available bed and are willing to accept the patient.  07/12/2013  Patient/family informed of [facility]' ownership interest in [facility], as well as of the fact that they are under no obligation to receive care at this facility.  PASARR submitted to EDS on  PASARR number received from EDS on   FL2 transmitted to all facilities in geographic area requested by pt/family on  07/12/2013 FL2 transmitted to all facilities within larger geographic area on   Patient informed that his/her managed care company has contracts with or will negotiate with  certain facilities, including the following:     Patient/family informed of bed offers received:  07/18/2013 [facility] Physician recommends and patient chooses bed at    [facility]  07/19/2013 [facility]  The following physician request were entered in Epic:   Additional Comments: 04/30 Patient with pre-existing A Pasarr and [facility] is for  [facility]  Elin Fenley, MSW, [facility] Clinical Social Worker [phone]

## 2013-07-13 NOTE — Progress Notes (Signed)
ANTICOAGULATION CONSULT NOTE - Follow Up Consult  Pharmacy Consult:  Heparin Indication:  B/L PE  Allergies  Allergen Reactions  . Codeine Other (See Comments)    Unknown     Patient Measurements: Height: 5\' 3"  (160 cm) Weight: 104 lb 6.4 oz (47.356 kg) IBW/kg (Calculated) : 52.4 Heparin Dosing Weight: 47 kg  Vital Signs: Temp: 98 F (36.7 C) (05/01 0606) Temp src: Oral (05/01 0606) BP: 128/51 mmHg (05/01 0606) Pulse Rate: 70 (05/01 0606)  Labs:  Recent Labs  07/11/13 2105 07/12/13 0147 07/12/13 1132 07/12/13 1515 07/12/13 2300 07/13/13 0500 07/13/13 0840  HGB 13.6 13.1  --   --   --  13.0 13.0  HCT 42.7 41.1  --   --   --  41.3 41.7  PLT 211 200  --   --   --  184 169  LABPROT  --   --  13.6  --   --   --   --   INR  --   --  1.06  --   --   --   --   HEPARINUNFRC  --   --   --   --   --   --  0.27*  CREATININE 0.86 0.77  --   --   --  0.83  --   TROPONINI  --   --   --  <0.30 <0.30 <0.30  --     Estimated Creatinine Clearance: 35.1 ml/min (by C-G formula based on Cr of 0.83).     Assessment: [age] YOF with new B/L PE to continue on IV heparin.  Heparin level slightly sub-therapeutic.  No issue reported.  Aware of potential surgery this PM.   Goal of Therapy:  Heparin level 0.3-0.7 units/ml Monitor platelets by anticoagulation protocol: Yes    Plan:  - Increase heparin gtt to 800 units/hr - Check 8 hr HL - Daily HL / CBC - F/U transitioning patient to oral AC post surgery    Montasia Chisenhall D. Laney Potashang, PharmD, BCPS Pager:  [phone] 07/13/2013, 9:43 AM

## 2013-07-13 NOTE — Progress Notes (Signed)
[facility] PROGRESS NOTE  Kathy [name] WUJ:[id number] DOB: 1923/01/15 DOA: 07/11/2013 PCP: Junious SilkALTHEIMER,MICHAEL D, MD  Assessment/Plan: Bilateral PE -CT angiogram last p.m. positive for bilateral PEs and patient started on full dose heparin -I have consulted CCM/pulmonology for surgical clearance in this patient requiring surgery in setting of acute PE>> Dr.Yacoub to see>> he states patient will need an IVC filter. Right hip fracture, status post Mechanical fall  -Per orthopedics Fever/probable  UTI  -Continue Rocephin Dementia  -Continue her outpatient medication Hypothyroidism  -Continue Synthroid  Code Status: DO NOT RESUSCITATE Family Communication: Daughter at bedside Disposition Plan: Pending clinical course   Consultants:  Orthopedics  pulmonology  Procedures:  IVC placement today  Antibiotics:  Rocephin started on 4/30  HPI/Subjective: Complaining of leg pain. Remains with venturi mask  Objective: Filed Vitals:   07/13/13 0606  BP: 128/51  Pulse: 70  Temp: 98 F (36.7 C)  Resp: 20   No intake or output data in the 24 hours ending 07/13/13 0830 Filed Weights   07/12/13 0042  Weight: 47.356 kg (104 lb 6.4 oz)    Exam:  General: alert & disoriented-answers simple questions appropriately In NAD Cardiovascular: RRR, nl S1 s2 Respiratory: CTAB Abdomen: soft +BS NT/ND, no masses palpable Extremities: No cyanosis and no edema    Data Reviewed: Basic Metabolic Panel:  Recent Labs Lab 07/11/13 2105 07/12/13 0147 07/13/13 0500  NA 146 144 144  K 4.0 4.2 4.0  CL 105 105 105  CO2 28 26 29   GLUCOSE 139* 123* 116*  BUN 11 10 13   CREATININE 0.86 0.77 0.83  CALCIUM 9.1 8.9 8.5   Liver Function Tests:  Recent Labs Lab 07/12/13 0147 07/13/13 0500  AST 15 13  ALT 10 9  ALKPHOS 97 88  BILITOT 0.7 1.1  PROT 6.5 6.0  ALBUMIN 3.1* 2.8*   No results found for this basename: LIPASE, AMYLASE,  in the last 168 hours No results found for  this basename: AMMONIA,  in the last 168 hours CBC:  Recent Labs Lab 07/11/13 2105 07/12/13 0147 07/13/13 0500  WBC 11.1* 10.3 9.3  NEUTROABS  --  8.0* 6.5  HGB 13.6 13.1 13.0  HCT 42.7 41.1 41.3  MCV 99.8 100.0 101.2*  PLT 211 200 184   Cardiac Enzymes:  Recent Labs Lab 07/12/13 1515 07/12/13 2300 07/13/13 0500  TROPONINI <0.30 <0.30 <0.30   BNP (last 3 results) No results found for this basename: PROBNP,  in the last 8760 hours CBG: No results found for this basename: GLUCAP,  in the last 168 hours  No results found for this or any previous visit (from the past 240 hour(s)).   Studies: Dg Hip Complete Right  07/11/2013   CLINICAL DATA:  Right hip pain after fall.  EXAM: RIGHT HIP - COMPLETE 2+ VIEW  COMPARISON:  None.  FINDINGS: Moderately displaced fracture is seen involving the right proximal femoral neck. Femoral head is well situated within the acetabulum.  IMPRESSION: Moderately displaced right proximal femoral neck fracture.   Electronically Signed   By: Roque LiasJames  Green M.D.   On: 07/11/2013 20:56   Ct Angio Chest Pe W/cm &/or Wo Cm  07/12/2013   CLINICAL DATA:  Shortness of breath.  Concern for pulmonary embolus.  EXAM: CT ANGIOGRAPHY CHEST WITH CONTRAST  TECHNIQUE: Multidetector CT imaging of the chest was performed using the standard protocol during bolus administration of intravenous contrast. Multiplanar CT image reconstructions and MIPs were obtained to evaluate the vascular anatomy.  CONTRAST:  100mL OMNIPAQUE IOHEXOL 350 MG/ML SOLN  COMPARISON:  Chest radiograph performed earlier today at 10:17 a.m.  FINDINGS: There appears to be subsegmental pulmonary embolus within pulmonary arterial branches to the left lingula and right lower lobe.  Trace bilateral pleural effusions are noted, with bibasilar atelectasis. There is no evidence of pneumothorax. No masses are identified; no abnormal focal contrast enhancement is seen.  The esophagus is diffusely dilated, with  opacification of the mid to distal esophagus, and diffuse wall thickening at the distal esophagus. An underlying mass cannot be excluded. Alternatively, this may reflect prior esophageal dilatation.  Scattered calcification is seen along the aortic arch and descending thoracic aorta. Scattered coronary artery calcifications are seen. The mediastinum is otherwise unremarkable in appearance. No mediastinal lymphadenopathy is seen. No pericardial effusion is identified. No axillary lymphadenopathy is seen. The thyroid gland is not well seen.  The visualized portions of the liver and spleen are unremarkable.  No acute osseous abnormalities are seen. There is mild chronic compression deformity involving vertebral body T10.  Review of the MIP images confirms the above findings.  IMPRESSION: 1. Small subsegmental pulmonary embolus within pulmonary arterial branches to the left lingula and right lower lobe. 2. Trace bilateral pleural effusions, with bibasilar atelectasis. 3. Diffuse esophageal dilatation, with opacification of the mid to distal esophagus, and diffuse wall thickening of the distal esophagus. An underlying mass cannot be excluded. Alternatively, this may reflect prior esophageal dilatation. Would correlate with prior clinical history and consider endoscopy for further evaluation. 4. Scattered coronary artery calcifications seen. 5. Scattered calcification along the aortic arch and descending thoracic aorta. 6. Mild chronic compression deformity involving vertebral body T10.  Critical Value/emergent results were called by telephone at the time of interpretation on 07/12/2013 at 11:15 PM to [facility]ev[facility] Sakyi RN on [facility], who verbally acknowledged these results.   Electronically Signed   By: Roanna RaiderJeffery  Chang M.D.   On: 07/12/2013 23:19   Dg Chest Port 1 View  07/12/2013   CLINICAL DATA:  Preop right leg fracture  EXAM: PORTABLE CHEST - 1 VIEW  COMPARISON:  DG CHEST 2 VIEW dated 12/21/2008; DG HIP COMPLETE*R* dated  07/11/2013  FINDINGS: Normal cardiac silhouette with ectatic aorta. Lungs are hyperinflated with bronchitic markings. No effusion, infiltrate, or pneumothorax.  IMPRESSION: 1.  No acute cardiopulmonary process. 2. Hyperinflated lungs.   Electronically Signed   By: Genevive BiStewart  Edmunds M.D.   On: 07/12/2013 10:46    Scheduled Meds: . aspirin EC  81 mg Oral Daily  . cefTRIAXone (ROCEPHIN)  IV  1 g Intravenous Daily  . donepezil  10 mg Oral Daily  . levothyroxine  88 mcg Oral QAC breakfast  . Memantine HCl ER  28 mg Oral Daily  . vitamin B-12  1,000 mcg Oral Daily  . Vitamin D (Ergocalciferol)  50,000 Units Oral Q30 days   Continuous Infusions: . sodium chloride 30 mL/hr at 07/12/13 1510  . heparin 700 Units/hr (07/13/13 0007)    Active Problems:   Femur fracture, right   Femoral neck fracture    Time spent: 35    Maleik Vanderzee C Tula Schryver  [facility] Pager [phone]. If 7PM-7AM, please contact night-coverage at [email or web address], password [email or web address] 07/13/2013, 8:30 AM  LOS: 2 days

## 2013-07-13 NOTE — Clinical Social Work Note (Signed)
Clinical Social Worker continuing to follow patient and family for support and discharge planning needs.  CSW spoke with patient daughter, Lupita LeashDonna, at bedside to provide bed offers and further discuss patient plans at discharge.  Patient daughter appreciative of bed offers and states that patient may possibly have surgery on Monday depending on progress over the weekend.  CSW will follow up with patient family on Monday to determine bed choice and discuss insurance authorization needs.  CSW remains available for support and to facilitate patient discharge needs once medically ready.  Macario GoldsJesse Jolanda Mccann, Kentucky[place]CS[place] [phone]

## 2013-07-13 NOTE — Consult Note (Signed)
Name: Kathy [name] MRN: [id number] DOB: 1922/07/06    ADMISSION DATE:  07/11/2013 CONSULTATION DATE:  Aida Raider  REFERRING MD :  Suanne Marker PRIMARY SERVICE:  [facility]: ortho and pulmonary    CHIEF COMPLAINT:  Pulmonary emboli  BRIEF PATIENT DESCRIPTION:  80 y.o. year old female with prior hx/o end stage dementia, HLD presented 4/29 with R femoral neck fracture s/p mechanical fall. Admitted to medical svc. Ortho consulted. Her course has been c/b fever, for which cultures have been sent and empiric antibiotics started; also desaturation. CT angio of chest was obtained which showed: sm sugseg PE w/in branches to Left lingula and right lower lobe. Anticoagulation was started. PCCM was asked to see in consult re: pulmonary clearance for surgery.     SIGNIFICANT EVENTS / STUDIES:  2022/07/19 admitted s/p fall 07/19/2022: desaturated, CT + PEs 2022/07/19 CT chest: 1. Small subsegmental pulmonary embolus within pulmonary arterial branches to the left lingula and right lower lobe. 2. Trace bilateral pleural effusions, with bibasilar atelectasis. 3. Diffuse esophageal dilatation, with opacification of the mid to distal esophagus, and diffuse wall thickening of the distal esophagus. An underlying mass cannot be excluded. Alternatively, this may reflect prior esophageal dilatation   LINES / TUBES:   CULTURES: BCX2 4/20>>> UC 4/20>>>  ANTIBIOTICS: CTX 4/30>>>  HISTORY OF PRESENT ILLNESS:    80 y.o. year old female with prior hx/o end stage dementia, HLD presented 4/29 with R femoral neck fracture s/p mechanical fall. Admitted to medical svc. Ortho consulted. Her course has been c/b fever, for which cultures have been sent and empiric antibiotics started; also desaturation. These desaturation events were most notable on 07-19-2022. Initially it was thought to be due to narcotics, however after narcan and time nursing staff was unable to wean O2. Because of this a CT angio of chest was obtained which showed: sm sugseg  PE w/in branches to Left lingula and right lower lobe. Anticoagulation was started. PCCM was asked to see in consult re: pulmonary clearance for surgery.   PAST MEDICAL HISTORY :  Past Medical History  Diagnosis Date  . Dementia   . Dyslipidemia   . Vitamin D deficiency   . Alzheimer's disease 06/16/2012  . Ankle fracture, left     As a child  . Hiatal hernia    Past Surgical History  Procedure Laterality Date  . Ankle fracture surgery Left   . Hiatal hernia repair N/A   . Cataract extraction Left    Prior to Admission medications   Medication Sig Start Date End Date Taking? Authorizing Provider  aspirin EC 81 MG tablet Take 81 mg by mouth daily.   Yes Historical Provider, MD  donepezil (ARICEPT) 10 MG tablet Take 1 tablet (10 mg total) by mouth daily. 03/09/13  Yes York Spaniel, MD  ergocalciferol (VITAMIN D2) 50000 UNITS capsule Take 50,000 Units by mouth every 30 (thirty) days.    Yes Historical Provider, MD  levothyroxine (SYNTHROID, LEVOTHROID) 88 MCG tablet Take 88 mcg by mouth daily before breakfast.   Yes Historical Provider, MD  Memantine HCl ER (NAMENDA XR) 28 MG CP24 Take 28 mg by mouth daily. 02/01/13  Yes York Spaniel, MD  vitamin B-12 (CYANOCOBALAMIN) 1000 MCG tablet Take 1,000 mcg by mouth daily.   Yes Historical Provider, MD   Allergies  Allergen Reactions  . Codeine Other (See Comments)    Unknown     FAMILY HISTORY:  Family History  Problem Relation Age of Onset  . Heart  disease Mother   . Cancer Father   . Heart disease Sister    SOCIAL HISTORY:  reports that she has never smoked. She does not have any smokeless tobacco history on file. She reports that she does not drink alcohol or use illicit drugs.  REVIEW OF SYSTEMS:   Unable d/t dementia   SUBJECTIVE:  No distress, but desaturates quickly off O2  VITAL SIGNS: Temp:  [97.5 F (36.4 C)-102.7 F (39.3 C)] 98 F (36.7 C) (05/01 0606) Pulse Rate:  [69-107] 70 (05/01 0606) Resp:  [20-24]  20 (05/01 0606) BP: (110-131)/(47-73) 128/51 mmHg (05/01 0606) SpO2:  [65 %-95 %] 94 % (05/01 0606) FiO2 (%):  [50 %] 50 % (05/01 0606)  PHYSICAL EXAMINATION: General:  Pleasant but confused elderly female, in no distress Neuro:  Awake, confused. Picking at O2 HEENT:  [place], no JVD  Cardiovascular:  rrr Lungs:  Fine crackles in bases, no accessory muscle use  Abdomen:  Soft, non-tender  Musculoskeletal:  RLE externally rotated  Skin:  Intact    Recent Labs Lab 07/11/13 2105 07/12/13 0147 07/13/13 0500  NA 146 144 144  K 4.0 4.2 4.0  CL 105 105 105  CO2 28 26 29   BUN 11 10 13   CREATININE 0.86 0.77 0.83  GLUCOSE 139* 123* 116*    Recent Labs Lab 07/11/13 2105 07/12/13 0147 07/13/13 0500  HGB 13.6 13.1 13.0  HCT 42.7 41.1 41.3  WBC 11.1* 10.3 9.3  PLT 211 200 184   Dg Hip Complete Right  07/11/2013   CLINICAL DATA:  Right hip pain after fall.  EXAM: RIGHT HIP - COMPLETE 2+ VIEW  COMPARISON:  None.  FINDINGS: Moderately displaced fracture is seen involving the right proximal femoral neck. Femoral head is well situated within the acetabulum.  IMPRESSION: Moderately displaced right proximal femoral neck fracture.   Electronically Signed   By: Roque LiasJames  Green M.D.   On: 07/11/2013 20:56   Ct Angio Chest Pe W/cm &/or Wo Cm  07/12/2013   CLINICAL DATA:  Shortness of breath.  Concern for pulmonary embolus.  EXAM: CT ANGIOGRAPHY CHEST WITH CONTRAST  TECHNIQUE: Multidetector CT imaging of the chest was performed using the standard protocol during bolus administration of intravenous contrast. Multiplanar CT image reconstructions and MIPs were obtained to evaluate the vascular anatomy.  CONTRAST:  100mL OMNIPAQUE IOHEXOL 350 MG/ML SOLN  COMPARISON:  Chest radiograph performed earlier today at 10:17 a.m.  FINDINGS: There appears to be subsegmental pulmonary embolus within pulmonary arterial branches to the left lingula and right lower lobe.  Trace bilateral pleural effusions are noted, with  bibasilar atelectasis. There is no evidence of pneumothorax. No masses are identified; no abnormal focal contrast enhancement is seen.  The esophagus is diffusely dilated, with opacification of the mid to distal esophagus, and diffuse wall thickening at the distal esophagus. An underlying mass cannot be excluded. Alternatively, this may reflect prior esophageal dilatation.  Scattered calcification is seen along the aortic arch and descending thoracic aorta. Scattered coronary artery calcifications are seen. The mediastinum is otherwise unremarkable in appearance. No mediastinal lymphadenopathy is seen. No pericardial effusion is identified. No axillary lymphadenopathy is seen. The thyroid gland is not well seen.  The visualized portions of the liver and spleen are unremarkable.  No acute osseous abnormalities are seen. There is mild chronic compression deformity involving vertebral body T10.  Review of the MIP images confirms the above findings.  IMPRESSION: 1. Small subsegmental pulmonary embolus within pulmonary arterial branches to the  left lingula and right lower lobe. 2. Trace bilateral pleural effusions, with bibasilar atelectasis. 3. Diffuse esophageal dilatation, with opacification of the mid to distal esophagus, and diffuse wall thickening of the distal esophagus. An underlying mass cannot be excluded. Alternatively, this may reflect prior esophageal dilatation. Would correlate with prior clinical history and consider endoscopy for further evaluation. 4. Scattered coronary artery calcifications seen. 5. Scattered calcification along the aortic arch and descending thoracic aorta. 6. Mild chronic compression deformity involving vertebral body T10.  Critical Value/emergent results were called by telephone at the time of interpretation on 07/12/2013 at 11:15 PM to [facility]aky[facility] RN on [facility], who verbally acknowledged these results.   Electronically Signed   By: Roanna RaiderJeffery  Chang M.D.   On: 07/12/2013 23:19   Dg  Chest Port 1 View  07/12/2013   CLINICAL DATA:  Preop right leg fracture  EXAM: PORTABLE CHEST - 1 VIEW  COMPARISON:  DG CHEST 2 VIEW dated 12/21/2008; DG HIP COMPLETE*R* dated 07/11/2013  FINDINGS: Normal cardiac silhouette with ectatic aorta. Lungs are hyperinflated with bronchitic markings. No effusion, infiltrate, or pneumothorax.  IMPRESSION: 1.  No acute cardiopulmonary process. 2. Hyperinflated lungs.   Electronically Signed   By: Genevive BiStewart  Edmunds M.D.   On: 07/12/2013 10:46    ASSESSMENT / PLAN: Mechanical fall  Right hip fracture  Fever. Posslble UTI Acute bilateral pulmonary emboli Dementia  Hypothyroidism   Discussion  In an ideal world therapeutic anticoagulation for 4-6 weeks prior to surgery would be the best way to proceed. Although there is no absolute contra-indication, proceeding to surgery w/ essentially untreated Pulmonary emboli does put this patient at higher risk of intra-operative and post operative death as well as post-operative respiratory complications such as: persistent hypoxia, failure to wean from ventilator, respiratory failure. This being said... This patient has advanced dementia, and what sounds like some significant functional and cognitive decline over the last year. She comes in after a mechanical fall and family states concern about gait disturbance over the last year at baseline. DO NOT THINK THAT SHE IS A GOOD CANDIDATE FOR SYSTEMIC ANTICOAGULATION. In this setting it seems like our primary goal here should be: pain management and trying to keep some degree of functional status. Therefore we would support surgery as long as the risks listed above are understood by the family and the surgical team. WE RECOMMEND PLACEMENT OF IVC FILTER. After this think going ahead with surgery if pt's O2 sats are stable and fever curve improved for 24 hrs would be reasonable.   RECS -get IVC filter -D/C heparin gtt after IVC filter placed -ORIF of right hip AFTER IVC and if  fever curve and pulse OX stable for 24hrs -Have discussed Goals of care w/ daughter.. Will make pt DNR.   PCCM will sign off, please call back if needed.  Patient seen and examined, agree with above note.  I dictated the care and orders written for this patient under my direction.  Alyson ReedyWesam G Jeriah Skufca, MD [phone]

## 2013-07-13 NOTE — Clinical Social Work Note (Signed)
Clinical Social Work Department BRIEF PSYCHOSOCIAL ASSESSMENT 07/13/2013  Patient:  Kathy [name], Kathy [name]     Account Number:  [id number]     Admit date:  07/11/2013  Clinical Social Worker:  Myles Lipps  Date/Time:  07/12/2013 12:15 PM  Referred by:  Physician  Date Referred:  07/12/2013 Referred for  SNF Placement   Other Referral:   Interview type:  Family Other interview type:   Patient daughter, Zigmund Daniel at bedside    PSYCHOSOCIAL DATA Living Status:  FAMILY Admitted from facility:   Level of care:   Primary support name:  Alveria Apley  [phone] Primary support relationship to patient:  CHILD, ADULT Degree of support available:   Strong    CURRENT CONCERNS Current Concerns  Post-Acute Placement   Other Concerns:    SOCIAL WORK ASSESSMENT / PLAN Clinical Social Worker met with patient daughter at bedside to offer support and discuss patient needs at discharge. Patient daughter states that she has been living with patient in patient home for the last year rotating around the clock care with her brother and sister.  Patient is in her final stages of Dementia and patient family has attempted to keep her home for as long as possible. Patient family would like patient to initially go to SNF for rehab purposes and then return home with them back to 24 hour care once physically able.    Patient daughter states that patient has been to [facility] in the past and family is adamant that she will not return.  Patient family expressed interest in [facility] due to location and family experience.  CSW has completed FL2 and initiated referral to [facility] with contact made to [facility] to express patient family interest.  CSW to follow up with patient family regarding potential bed offers once available.  CSW remains available for support and to facilitate patient discharge needs once medically stable.   Assessment/plan status:  Psychosocial Support/Ongoing Assessment of  Needs Other assessment/ plan:   Information/referral to community resources:   Holiday representative offered patient daughter a list of facilities, however she states that it would be best to provide patient other daughter, Butch Penny with the list tomorrow once available bed offers are present.    PATIENT'S/FAMILY'S RESPONSE TO PLAN OF CARE: Patient oriented to self only and sleeping at the time of CSW assessment.  Patient in the late stages of Dementia with 3 children who provide exceptional care for her at home.  Patient children are very realistic in stating that patient must improve physically prior to return home. Patient family has expressed their willingness to sit with patient during the day at the facility but recognizes that SNF is the best discharge option at this time.  Patient family very appreciative of CSW support and involvement.

## 2013-07-13 NOTE — Progress Notes (Signed)
Patient came from IR arousable accompanying with family. Venturi mask in situ. Assessments remains unchanged.

## 2013-07-14 LAB — CBC WITH DIFFERENTIAL/PLATELET
Basophils Absolute: 0 10*3/uL (ref 0.0–0.1)
Basophils Relative: 0 % (ref 0–1)
EOS ABS: 0.2 10*3/uL (ref 0.0–0.7)
Eosinophils Relative: 3 % (ref 0–5)
HCT: 42.1 % (ref 36.0–46.0)
HEMOGLOBIN: 13.5 g/dL (ref 12.0–15.0)
Lymphocytes Relative: 14 % (ref 12–46)
Lymphs Abs: 1.2 10*3/uL (ref 0.7–4.0)
MCH: 31.9 pg (ref 26.0–34.0)
MCHC: 32.1 g/dL (ref 30.0–36.0)
MCV: 99.5 fL (ref 78.0–100.0)
MONOS PCT: 9 % (ref 3–12)
Monocytes Absolute: 0.8 10*3/uL (ref 0.1–1.0)
NEUTROS PCT: 74 % (ref 43–77)
Neutro Abs: 6.9 10*3/uL (ref 1.7–7.7)
Platelets: 188 10*3/uL (ref 150–400)
RBC: 4.23 MIL/uL (ref 3.87–5.11)
RDW: 14.2 % (ref 11.5–15.5)
WBC: 9.1 10*3/uL (ref 4.0–10.5)

## 2013-07-14 LAB — COMPREHENSIVE METABOLIC PANEL
ALBUMIN: 2.6 g/dL — AB (ref 3.5–5.2)
ALT: 8 U/L (ref 0–35)
AST: 15 U/L (ref 0–37)
Alkaline Phosphatase: 93 U/L (ref 39–117)
BUN: 10 mg/dL (ref 6–23)
CALCIUM: 8.5 mg/dL (ref 8.4–10.5)
CO2: 30 mEq/L (ref 19–32)
CREATININE: 0.65 mg/dL (ref 0.50–1.10)
Chloride: 101 mEq/L (ref 96–112)
GFR calc Af Amer: 89 mL/min — ABNORMAL LOW (ref >90)
GFR calc non Af Amer: 77 mL/min — ABNORMAL LOW (ref >90)
Glucose, Bld: 122 mg/dL — ABNORMAL HIGH (ref 70–99)
Potassium: 3.6 mEq/L — ABNORMAL LOW (ref 3.7–5.3)
Sodium: 140 mEq/L (ref 137–147)
Total Bilirubin: 1.1 mg/dL (ref 0.3–1.2)
Total Protein: 6.3 g/dL (ref 6.0–8.3)

## 2013-07-14 MED ORDER — ENOXAPARIN SODIUM 30 MG/0.3ML ~~LOC~~ SOLN
30.0000 mg | SUBCUTANEOUS | Status: AC
  Administered 2013-07-14 – 2013-07-15 (×2): 30 mg via SUBCUTANEOUS
  Filled 2013-07-14 (×2): qty 0.3

## 2013-07-14 NOTE — Progress Notes (Signed)
[facility] PROGRESS NOTE  Kathy [name] ZOX:[id number] DOB: [birth year] DOA: 07/11/2013 PCP: Junious Silk, MD  Assessment/Plan: Bilateral PE -CT angiogram last p.m. positive for bilateral PEs and patient started on full dose heparin -I have consulted CCM/pulmonology for surgical clearance in this patient requiring surgery in setting of acute PE>> Dr.Yacoub  saw patient and recommended IVC filter which was placed on 5/1 -Pulmonary also recommended to discontinue IV heparin after her filter was placed and this was done -Defer DVT prophylaxis to orthopedics Right hip fracture, status post Mechanical fall  -Per orthopedics, surgery planned on Monday -As above defer DVT prophylaxis to orthopedics Fever/probable  UTI  -Continue Rocephin  -blood and urine cultures remained with no growth to date  Dementia  -Continue her outpatient medication Hypothyroidism  -Continue Synthroid  Code Status: DO NOT RESUSCITATE Family Communication: Daughter at bedside Disposition Plan: Pending clinical course   Consultants:  Orthopedics  pulmonology  Procedures:  IVC placement today  Antibiotics:  Rocephin started on 4/30  HPI/Subjective:  doing better today, pain better controlled. Alert and answers simple questions appropriately. On nasal cannula O2 with sats in the 90s  Objective: Filed Vitals:   07/14/13 1351  BP: 118/63  Pulse: 88  Temp: 97.8 F (36.6 C)  Resp: 20    Intake/Output Summary (Last 24 hours) at 07/14/13 1559 Last data filed at 07/14/13 1353  Gross per 24 hour  Intake    720 ml  Output      0 ml  Net    720 ml   Filed Weights   07/12/13 0042  Weight: 47.356 kg (104 lb 6.4 oz)    Exam:  General: alert & disoriented-answers simple questions appropriately In NAD Cardiovascular: RRR, nl S1 s2 Respiratory: CTAB Abdomen: soft +BS NT/ND, no masses palpable Extremities: No cyanosis and no edema    Data Reviewed: Basic Metabolic  Panel:  Recent Labs Lab 07/11/13 2105 07/12/13 0147 07/13/13 0500 07/14/13 0433  NA 146 144 144 140  K 4.0 4.2 4.0 3.6*  CL 105 105 105 101  CO2 28 26 29 30   GLUCOSE 139* 123* 116* 122*  BUN 11 10 13 10   CREATININE 0.86 0.77 0.83 0.65  CALCIUM 9.1 8.9 8.5 8.5   Liver Function Tests:  Recent Labs Lab 07/12/13 0147 07/13/13 0500 07/14/13 0433  AST 15 13 15   ALT 10 9 8   ALKPHOS 97 88 93  BILITOT 0.7 1.1 1.1  PROT 6.5 6.0 6.3  ALBUMIN 3.1* 2.8* 2.6*   No results found for this basename: LIPASE, AMYLASE,  in the last 168 hours No results found for this basename: AMMONIA,  in the last 168 hours CBC:  Recent Labs Lab 07/11/13 2105 07/12/13 0147 07/13/13 0500 07/13/13 0840 07/14/13 0433  WBC 11.1* 10.3 9.3 9.7 9.1  NEUTROABS  --  8.0* 6.5  --  6.9  HGB 13.6 13.1 13.0 13.0 13.5  HCT 42.7 41.1 41.3 41.7 42.1  MCV 99.8 100.0 101.2* 101.5* 99.5  PLT 211 200 184 169 188   Cardiac Enzymes:  Recent Labs Lab 07/12/13 1515 07/12/13 2300 07/13/13 0500  TROPONINI <0.30 <0.30 <0.30   BNP (last 3 results) No results found for this basename: PROBNP,  in the last 8760 hours CBG: No results found for this basename: GLUCAP,  in the last 168 hours  Recent Results (from the past 240 hour(s))  URINE CULTURE     Status: None   Collection Time    07/12/13  4:13 PM  Result Value Ref Range Status   Specimen Description URINE, CATHETERIZED   Final   Special Requests NONE   Final   Culture  Setup Time     Final   Value: 07/12/2013 17:02     Performed at [facility]   Colony Count     Final   Value: NO GROWTH     Performed at [facility]   Culture     Final   Value: NO GROWTH     Performed at [facility]   Report Status 07/13/2013 FINAL   Final  CULTURE, BLOOD (ROUTINE X 2)     Status: None   Collection Time    07/12/13  4:26 PM      Result Value Ref Range Status   Specimen Description BLOOD RIGHT HAND   Final   Special Requests BOTTLES  DRAWN AEROBIC ONLY 3CC   Final   Culture  Setup Time     Final   Value: 07/12/2013 20:55     Performed at [facility]   Culture     Final   Value:        BLOOD CULTURE RECEIVED NO GROWTH TO DATE CULTURE WILL BE HELD FOR 5 DAYS BEFORE ISSUING A FINAL NEGATIVE REPORT     Performed at [facility]   Report Status PENDING   Incomplete  CULTURE, BLOOD (ROUTINE X 2)     Status: None   Collection Time    07/12/13  4:40 PM      Result Value Ref Range Status   Specimen Description BLOOD LEFT HAND   Final   Special Requests BOTTLES DRAWN AEROBIC ONLY 4CC   Final   Culture  Setup Time     Final   Value: 07/12/2013 20:55     Performed at [facility]   Culture     Final   Value:        BLOOD CULTURE RECEIVED NO GROWTH TO DATE CULTURE WILL BE HELD FOR 5 DAYS BEFORE ISSUING A FINAL NEGATIVE REPORT     Performed at [facility]   Report Status PENDING   Incomplete     Studies: Ct Angio Chest Pe W/cm &/or Wo Cm  07/12/2013   CLINICAL DATA:  Shortness of breath.  Concern for pulmonary embolus.  EXAM: CT ANGIOGRAPHY CHEST WITH CONTRAST  TECHNIQUE: Multidetector CT imaging of the chest was performed using the standard protocol during bolus administration of intravenous contrast. Multiplanar CT image reconstructions and MIPs were obtained to evaluate the vascular anatomy.  CONTRAST:  OMNIPAQUE IOHEXOL 350 MG/ML SOLN  COMPARISON:  Chest radiograph performed earlier today at 10:17 a.m.  FINDINGS: There appears to be subsegmental pulmonary embolus within pulmonary arterial branches to the left lingula and right lower lobe.  Trace bilateral pleural effusions are noted, with bibasilar atelectasis. There is no evidence of pneumothorax. No masses are identified; no abnormal focal contrast enhancement is seen.  The esophagus is diffusely dilated, with opacification of the mid to distal esophagus, and diffuse wall thickening at the distal esophagus. An underlying mass cannot be  excluded. Alternatively, this may reflect prior esophageal dilatation.  Scattered calcification is seen along the aortic arch and descending thoracic aorta. Scattered coronary artery calcifications are seen. The mediastinum is otherwise unremarkable in appearance. No mediastinal lymphadenopathy is seen. No pericardial effusion is identified. No axillary lymphadenopathy is seen. The thyroid gland is not well seen.  The visualized portions of the liver and  spleen are unremarkable.  No acute osseous abnormalities are seen. There is mild chronic compression deformity involving vertebral body T10.  Review of the MIP images confirms the above findings.  IMPRESSION: 1. Small subsegmental pulmonary embolus within pulmonary arterial branches to the left lingula and right lower lobe. 2. Trace bilateral pleural effusions, with bibasilar atelectasis. 3. Diffuse esophageal dilatation, with opacification of the mid to distal esophagus, and diffuse wall thickening of the distal esophagus. An underlying mass cannot be excluded. Alternatively, this may reflect prior esophageal dilatation. Would correlate with prior clinical history and consider endoscopy for further evaluation. 4. Scattered coronary artery calcifications seen. 5. Scattered calcification along the aortic arch and descending thoracic aorta. 6. Mild chronic compression deformity involving vertebral body T10.  Critical Value/emergent results were called by telephone at the time of interpretation on 07/12/2013 at 11:15 PM to [facility] SGenevieve Sakyi RN [facility], who verbally acknowledged these results.   Electronically Signed   By: Roanna RaiderJeffery  Chang M.D.   On: 07/12/2013 23:19   Ir Ivc Filter Plmt / S&i /img Guid/mod Sed  07/13/2013   INDICATION: 80 year old female with dementia and recent fall with hip fracture. Incidentally during workup to assess her injuries she was found have multiple bilateral PE. She will surely undergo hip surgery and is considered high risk for  anticoagulation given her age and high risk for recurrent falls. A permanent IVC filter will therefore be placed for PE prophylaxis.  EXAM: ULTRASOUND GUIDANCE FOR VASCULARACCESS  IVC CATHETERIZATION AND VENOGRAM  IVC FILTER INSERTION  COMPARISON:  CT PE study 07/12/2013  MEDICATIONS: Fentanyl 1 mcg IV; Versed 25 mg IV  ANESTHESIA/SEDATION: Sedation Time  Twenty-five minutes  CONTRAST:  40mL OMNIPAQUE IOHEXOL 300 MG/ML  SOLN  FLUOROSCOPY TIME:  5 minutes 35 seconds  COMPLICATIONS: None immediate  PROCEDURE: Informed consent was obtained from the patient's daughter following explanation of the procedure, risks, benefits and alternatives. The patient understands, agrees and consents for the procedure. All questions were addressed. A time out was performed prior to the initiation of the procedure.  Maximal barrier sterile technique utilized including caps, mask, sterile gowns, sterile gloves, large sterile drape, hand hygiene, and Betadine prep.  Under sterile condition and local anesthesia, right internal jugular venous access was performed with ultrasound. An ultrasound image was saved and sent to PACS. Over a guidewire, the IVC filter delivery sheath and inner dilator were advanced into the IVC just above the IVC bifurcation. Contrast injection was performed for an IVC venogram.  Through the delivery sheath, a permanent TrapEase IVC filter was deployed below the level of the renal veins and above the IVC bifurcation. Limited post deployment venacavagram was performed.  The delivery sheath was removed and hemostasis was obtained with manual compression. A dressing was placed. The patient tolerated the procedure well without immediate post procedural complication.  FINDINGS: The IVC is patent. No evidence of thrombus, stenosis, or occlusion. No variant venous anatomy. Successful placement of the IVC filter below the level of the renal veins.  IMPRESSION: Successful ultrasound and fluoroscopically guided placement of  an infrarenal permanent IVC filter via right jugular approach.  Signed,  Sterling BigHeath K. McCullough, MD  [facility]  [facility]   Electronically Signed   By: Malachy MoanHeath  McCullough M.D.   On: 07/13/2013 15:26    Scheduled Meds: . aspirin EC  81 mg Oral Daily  . cefTRIAXone (ROCEPHIN)  IV  1 g Intravenous Daily  . donepezil  10 mg Oral Daily  .  enoxaparin (LOVENOX) injection  30 mg Subcutaneous Q24H  . levothyroxine  88 mcg Oral QAC breakfast  . Memantine HCl ER  28 mg Oral Daily  . vitamin B-12  1,000 mcg Oral Daily  . Vitamin D (Ergocalciferol)  50,000 Units Oral Q30 days   Continuous Infusions: . sodium chloride 30 mL/hr at 07/14/13 [id number]    Active Problems:   Femur fracture, right   Femoral neck fracture   Acute pulmonary embolism   End of life care    Time spent: 25    Kela MillinAdeline C Obie Kallenbach  [facility] Pager [phone]. If 7PM-7AM, please contact night-coverage at [email or web address], password [email or web address] 07/14/2013, 3:59 PM  LOS: 3 days

## 2013-07-14 NOTE — Progress Notes (Signed)
Patient ID: Kathy [name], female   DOB: 1922-08-29, 80 y.o.   MRN: [id number]  Right femoral neck fracture complicated by newly diagnosed pulmonary emboli IVC filter placed yesterday  Needs hip fixed for pain control and mobility  Plan currently is to allow her to stabilize further from a pulmonary standpoint prior to going to the OR Monday for hemiarthroplasty Despite IVC filter would still maintain chemoprophylaxis for next 2 days and post operatively  Regular diet for now NPO after midnight Sunday the 3rd

## 2013-07-15 DIAGNOSIS — E876 Hypokalemia: Secondary | ICD-10-CM

## 2013-07-15 LAB — COMPREHENSIVE METABOLIC PANEL
ALK PHOS: 80 U/L (ref 39–117)
ALT: 13 U/L (ref 0–35)
AST: 16 U/L (ref 0–37)
Albumin: 2.4 g/dL — ABNORMAL LOW (ref 3.5–5.2)
BILIRUBIN TOTAL: 1 mg/dL (ref 0.3–1.2)
BUN: 9 mg/dL (ref 6–23)
CO2: 28 meq/L (ref 19–32)
Calcium: 8.3 mg/dL — ABNORMAL LOW (ref 8.4–10.5)
Chloride: 103 mEq/L (ref 96–112)
Creatinine, Ser: 0.64 mg/dL (ref 0.50–1.10)
GFR, EST AFRICAN AMERICAN: 90 mL/min — AB (ref >90)
GFR, EST NON AFRICAN AMERICAN: 77 mL/min — AB (ref >90)
Glucose, Bld: 116 mg/dL — ABNORMAL HIGH (ref 70–99)
POTASSIUM: 3.3 meq/L — AB (ref 3.7–5.3)
Sodium: 144 mEq/L (ref 137–147)
TOTAL PROTEIN: 5.8 g/dL — AB (ref 6.0–8.3)

## 2013-07-15 LAB — CBC WITH DIFFERENTIAL/PLATELET
BASOS ABS: 0 10*3/uL (ref 0.0–0.1)
BASOS PCT: 0 % (ref 0–1)
Eosinophils Absolute: 0.2 10*3/uL (ref 0.0–0.7)
Eosinophils Relative: 3 % (ref 0–5)
HEMATOCRIT: 40.4 % (ref 36.0–46.0)
Hemoglobin: 12.7 g/dL (ref 12.0–15.0)
Lymphocytes Relative: 17 % (ref 12–46)
Lymphs Abs: 1.2 10*3/uL (ref 0.7–4.0)
MCH: 31.1 pg (ref 26.0–34.0)
MCHC: 31.4 g/dL (ref 30.0–36.0)
MCV: 98.8 fL (ref 78.0–100.0)
Monocytes Absolute: 0.6 10*3/uL (ref 0.1–1.0)
Monocytes Relative: 8 % (ref 3–12)
NEUTROS ABS: 5.1 10*3/uL (ref 1.7–7.7)
Neutrophils Relative %: 72 % (ref 43–77)
PLATELETS: 201 10*3/uL (ref 150–400)
RBC: 4.09 MIL/uL (ref 3.87–5.11)
RDW: 14 % (ref 11.5–15.5)
WBC: 7.2 10*3/uL (ref 4.0–10.5)

## 2013-07-15 NOTE — Progress Notes (Signed)
Subjective: Awake and talkative with dtr in room.   Objective: Vital signs in last 24 hours: Temp:  [97.8 F (36.6 C)-98.3 F (36.8 C)] 98 F (36.7 C) (05/03 0610) Pulse Rate:  [86-89] 86 (05/03 0610) Resp:  [20] 20 (05/03 0610) BP: (111-118)/(63-73) 111/73 mmHg (05/03 0610) SpO2:  [95 %-98 %] 97 % (05/03 0610)  Intake/Output from previous day: 05/02 0701 - 05/03 0700 In: 480 [P.O.:480] Out: -  Intake/Output this shift:     Recent Labs  07/13/13 0500 07/13/13 0840 07/14/13 0433 07/15/13 0712  HGB 13.0 13.0 13.5 12.7    Recent Labs  07/14/13 0433 07/15/13 0712  WBC 9.1 7.2  RBC 4.23 4.09  HCT 42.1 40.4  PLT 188 201    Recent Labs  07/13/13 0500 07/14/13 0433  NA 144 140  K 4.0 3.6*  CL 105 101  CO2 29 30  BUN 13 10  CREATININE 0.83 0.65  GLUCOSE 116* 122*  CALCIUM 8.5 8.5    Recent Labs  07/12/13 1132  INR 1.06    Intact pulses distally  Minimal swelling calf soft moves foot and ankle well.  Assessment/Plan: NPO after MN   Will plan for surgery tomorrow if remains stable. Talked with pt and dtr   Kathy [name] 07/15/2013, 8:59 AM

## 2013-07-15 NOTE — Progress Notes (Signed)
[facility] PROGRESS NOTE  Kathy [name] JXB:[id number] DOB: [birth year] DOA: 07/11/2013 PCP: Kathy [name]  Assessment/Plan: Bilateral PE -CT angiogram last p.m. positive for bilateral PEs and patient started on full dose heparin -I have consulted CCM/pulmonology for surgical clearance in this patient requiring surgery in setting of acute PE>> Dr.Yacoub  saw patient and recommended IVC filter which was placed on 5/1 -Pulmonary also recommended to discontinue IV heparin after her filter was placed and this was done -DVT prophylaxis deferred to orthopedics -She remains on nasal cannula oxygen at this time with O2 sats in the 90s Right hip fracture, status post Mechanical fall  -Per orthopedics, surgery planned in a.m. -As above  DVT prophylaxis deferred to orthopedics Fever/probable  UTI  -Continue Rocephin, she remains afebrile and hemodynamically stable -blood and urine cultures remained with no growth to date  Dementia  -Continue her outpatient medication Hypothyroidism  -Continue Synthroid Hypokalemia -Replace K.  Code Status: DO NOT RESUSCITATE Family Communication: Daughter at bedside Disposition Plan: Pending clinical course   Consultants:  Orthopedics  pulmonology  Procedures:  IVC placement today  Antibiotics:  Rocephin started on 4/30  HPI/Subjective: Sleeping but easily aroused, daughter at bedside. Objective: Filed Vitals:   07/15/13 1330  BP: 117/59  Pulse: 80  Temp: 98.4 F (36.9 C)  Resp: 20   No intake or output data in the 24 hours ending 07/15/13 1512 Filed Weights   07/12/13 0042  Weight: 47.356 kg (104 lb 6.4 oz)    Exam:  General: alert & disoriented-answers simple questions appropriately In NAD Cardiovascular: RRR, nl S1 s2 Respiratory: CTAB Abdomen: soft +BS NT/ND, no masses palpable Extremities: No cyanosis and no edema    Data Reviewed: Basic Metabolic Panel:  Recent Labs Lab 07/11/13 2105  07/12/13 0147 07/13/13 0500 07/14/13 0433 07/15/13 0712  NA 146 144 144 140 144  K 4.0 4.2 4.0 3.6* 3.3*  CL 105 105 105 101 103  CO2 28 26 29 30 28   GLUCOSE 139* 123* 116* 122* 116*  BUN 11 10 13 10 9   CREATININE 0.86 0.77 0.83 0.65 0.64  CALCIUM 9.1 8.9 8.5 8.5 8.3*   Liver Function Tests:  Recent Labs Lab 07/12/13 0147 07/13/13 0500 07/14/13 0433 07/15/13 0712  AST 15 13 15 16   ALT 10 9 8 13   ALKPHOS 97 88 93 80  BILITOT 0.7 1.1 1.1 1.0  PROT 6.5 6.0 6.3 5.8*  ALBUMIN 3.1* 2.8* 2.6* 2.4*   No results found for this basename: LIPASE, AMYLASE,  in the last 168 hours No results found for this basename: AMMONIA,  in the last 168 hours CBC:  Recent Labs Lab 07/12/13 0147 07/13/13 0500 07/13/13 0840 07/14/13 0433 07/15/13 0712  WBC 10.3 9.3 9.7 9.1 7.2  NEUTROABS 8.0* 6.5  --  6.9 5.1  HGB 13.1 13.0 13.0 13.5 12.7  HCT 41.1 41.3 41.7 42.1 40.4  MCV 100.0 101.2* 101.5* 99.5 98.8  PLT 200 184 169 188 201   Cardiac Enzymes:  Recent Labs Lab 07/12/13 1515 07/12/13 2300 07/13/13 0500  TROPONINI <0.30 <0.30 <0.30   BNP (last 3 results) No results found for this basename: PROBNP,  in the last 8760 hours CBG: No results found for this basename: GLUCAP,  in the last 168 hours  Recent Results (from the past 240 hour(s))  URINE CULTURE     Status: None   Collection Time    07/12/13  4:13 PM      Result Value Ref Range Status  Specimen Description URINE, CATHETERIZED   Final   Special Requests NONE   Final   Culture  Setup Time     Final   Value: 07/12/2013 17:02     [facility]   Colony Count     Final   Value: NO GROWTH     [facility]   Culture     Final   Value: NO GROWTH     [facility] at [facility]   Report Status 07/13/2013 FINAL   Final  CULTURE, BLOOD (ROUTINE X 2)     Status: None   Collection Time    07/12/13  4:26 PM      Result Value Ref Range Status   Specimen Description BLOOD RIGHT  HAND   Final   Special Requests BOTTLES DRAWN AEROBIC ONLY 3CC   Final   Culture  Setup Time     Final   Value: 07/12/2013 20:55     [facility]formed at [facility]   Culture     Final   Value:        BLOOD CULTURE RECEIVED NO GROWTH TO DATE CULTURE WILL BE HELD FOR 5 DAYS BEFORE ISSUING A FINAL NEGATIVE REPORT     [facility]formed at [facility]   Report Status PENDING   Incomplete  CULTURE, BLOOD (ROUTINE X 2)     Status: None   Collection Time    07/12/13  4:40 PM      Result Value Ref Range Status   Specimen Description BLOOD LEFT HAND   Final   Special Requests BOTTLES DRAWN AEROBIC ONLY 4CC   Final   Culture  Setup Time     Final   Value: 07/12/2013 20:55     [facility] at [facility]   Culture     Final   Value:        BLOOD CULTURE RECEIVED NO GROWTH TO DATE CULTURE WILL BE HELD FOR 5 DAYS BEFORE ISSUING A FINAL NEGATIVE REPORT     [facility] at [facility]   Report Status PENDING   Incomplete     Studies: No results found.  Scheduled Meds: . aspirin EC  81 mg Oral Daily  . cefTRIAXone (ROCEPHIN)  IV  1 g Intravenous Daily  . donepezil  10 mg Oral Daily  . levothyroxine  88 mcg Oral QAC breakfast  . Memantine HCl ER  28 mg Oral Daily  . vitamin B-12  1,000 mcg Oral Daily  . Vitamin D (Ergocalciferol)  50,000 Units Oral Q30 days   Continuous Infusions: . sodium chloride 30 mL/hr at 07/14/13 [id number]    Active Problems:   Femur fracture, right   Femoral neck fracture   Acute pulmonary embolism   End of life care    Time spent: 25    Kathy [name]  [facility] Pager [phone]. If 7PM-7AM, please contact night-coverage at [email or web address], password [email or web address] 07/15/2013, 3:12 PM  LOS: 4 days

## 2013-07-16 ENCOUNTER — Inpatient Hospital Stay (HOSPITAL_COMMUNITY): Payer: Medicare HMO

## 2013-07-16 ENCOUNTER — Inpatient Hospital Stay (HOSPITAL_COMMUNITY): Payer: Medicare HMO | Admitting: Anesthesiology

## 2013-07-16 ENCOUNTER — Encounter (HOSPITAL_COMMUNITY): Payer: Self-pay | Admitting: Certified Registered Nurse Anesthetist

## 2013-07-16 ENCOUNTER — Encounter (HOSPITAL_COMMUNITY)
Admission: EM | Disposition: A | Discharge status: Skilled Nursing Facility | Payer: Self-pay | Source: Home / Self Care | Attending: Internal Medicine

## 2013-07-16 ENCOUNTER — Encounter (HOSPITAL_COMMUNITY): Payer: Medicare HMO | Admitting: Anesthesiology

## 2013-07-16 HISTORY — PX: HIP ARTHROPLASTY: SHX981

## 2013-07-16 LAB — CBC WITH DIFFERENTIAL/PLATELET
BASOS PCT: 1 % (ref 0–1)
Basophils Absolute: 0 10*3/uL (ref 0.0–0.1)
EOS PCT: 4 % (ref 0–5)
Eosinophils Absolute: 0.3 10*3/uL (ref 0.0–0.7)
HCT: 41.6 % (ref 36.0–46.0)
Hemoglobin: 13.2 g/dL (ref 12.0–15.0)
LYMPHS ABS: 2 10*3/uL (ref 0.7–4.0)
Lymphocytes Relative: 27 % (ref 12–46)
MCH: 31.6 pg (ref 26.0–34.0)
MCHC: 31.7 g/dL (ref 30.0–36.0)
MCV: 99.5 fL (ref 78.0–100.0)
MONOS PCT: 7 % (ref 3–12)
Monocytes Absolute: 0.5 10*3/uL (ref 0.1–1.0)
NEUTROS PCT: 61 % (ref 43–77)
Neutro Abs: 4.5 10*3/uL (ref 1.7–7.7)
PLATELETS: 210 10*3/uL (ref 150–400)
RBC: 4.18 MIL/uL (ref 3.87–5.11)
RDW: 14.2 % (ref 11.5–15.5)
WBC: 7.3 10*3/uL (ref 4.0–10.5)

## 2013-07-16 LAB — COMPREHENSIVE METABOLIC PANEL
ALBUMIN: 2.3 g/dL — AB (ref 3.5–5.2)
ALT: 9 U/L (ref 0–35)
AST: 17 U/L (ref 0–37)
Alkaline Phosphatase: 74 U/L (ref 39–117)
BUN: 10 mg/dL (ref 6–23)
CHLORIDE: 105 meq/L (ref 96–112)
CO2: 24 mEq/L (ref 19–32)
Calcium: 8.5 mg/dL (ref 8.4–10.5)
Creatinine, Ser: 0.62 mg/dL (ref 0.50–1.10)
GFR calc Af Amer: >90 mL/min (ref >90)
GFR calc non Af Amer: 78 mL/min — ABNORMAL LOW (ref >90)
Glucose, Bld: 104 mg/dL — ABNORMAL HIGH (ref 70–99)
Potassium: 3.5 mEq/L — ABNORMAL LOW (ref 3.7–5.3)
SODIUM: 142 meq/L (ref 137–147)
Total Bilirubin: 0.7 mg/dL (ref 0.3–1.2)
Total Protein: 5.8 g/dL — ABNORMAL LOW (ref 6.0–8.3)

## 2013-07-16 LAB — SURGICAL PCR SCREEN
MRSA, PCR: NEGATIVE
STAPHYLOCOCCUS AUREUS: NEGATIVE

## 2013-07-16 SURGERY — HEMIARTHROPLASTY, HIP, DIRECT ANTERIOR APPROACH, FOR FRACTURE
Anesthesia: Spinal | Laterality: Right

## 2013-07-16 MED ORDER — LACTATED RINGERS IV SOLN
INTRAVENOUS | Status: DC
  Administered 2013-07-16: 50 mL/h via INTRAVENOUS

## 2013-07-16 MED ORDER — PROPOFOL 10 MG/ML IV BOLUS
INTRAVENOUS | Status: DC | PRN
  Administered 2013-07-16: 10 mg via INTRAVENOUS

## 2013-07-16 MED ORDER — ONDANSETRON HCL 4 MG/2ML IJ SOLN: 4.0000 mg | Freq: Four times a day (QID) | INTRAMUSCULAR | Status: DC | PRN

## 2013-07-16 MED ORDER — HYDROCODONE-ACETAMINOPHEN 5-325 MG PO TABS: 1.0000 | ORAL_TABLET | Freq: Four times a day (QID) | ORAL | Status: DC | PRN

## 2013-07-16 MED ORDER — METOCLOPRAMIDE HCL 5 MG/ML IJ SOLN
5.0000 mg | Freq: Three times a day (TID) | INTRAMUSCULAR | Status: DC | PRN
  Filled 2013-07-16: qty 2

## 2013-07-16 MED ORDER — MAGNESIUM CITRATE PO SOLN
0.5000 | Freq: Once | ORAL | Status: AC | PRN
  Filled 2013-07-16: qty 296

## 2013-07-16 MED ORDER — PHENOL 1.4 % MT LIQD: 1.0000 | OROMUCOSAL | Status: DC | PRN

## 2013-07-16 MED ORDER — CEFAZOLIN SODIUM-DEXTROSE 2-3 GM-% IV SOLR
INTRAVENOUS | Status: DC | PRN
  Administered 2013-07-16: 2 g via INTRAVENOUS

## 2013-07-16 MED ORDER — FENTANYL CITRATE 0.05 MG/ML IJ SOLN
INTRAMUSCULAR | Status: AC
  Filled 2013-07-16: qty 5

## 2013-07-16 MED ORDER — POLYETHYLENE GLYCOL 3350 17 G PO PACK
17.0000 g | PACK | Freq: Every day | ORAL | Status: DC | PRN
  Filled 2013-07-16: qty 1

## 2013-07-16 MED ORDER — ENOXAPARIN SODIUM 40 MG/0.4ML ~~LOC~~ SOLN: 40.0000 mg | SUBCUTANEOUS | Status: DC

## 2013-07-16 MED ORDER — ACETAMINOPHEN 325 MG PO TABS
650.0000 mg | ORAL_TABLET | Freq: Four times a day (QID) | ORAL | Status: DC | PRN
  Administered 2013-07-17: 650 mg via ORAL
  Filled 2013-07-16 (×9): qty 2

## 2013-07-16 MED ORDER — PHENYLEPHRINE 40 MCG/ML (10ML) SYRINGE FOR IV PUSH (FOR BLOOD PRESSURE SUPPORT)
PREFILLED_SYRINGE | INTRAVENOUS | Status: AC
  Filled 2013-07-16: qty 10

## 2013-07-16 MED ORDER — DOCUSATE SODIUM 100 MG PO CAPS
100.0000 mg | ORAL_CAPSULE | Freq: Two times a day (BID) | ORAL | Status: DC
  Administered 2013-07-16 – 2013-07-19 (×5): 100 mg via ORAL
  Filled 2013-07-16 (×7): qty 1

## 2013-07-16 MED ORDER — CEFAZOLIN SODIUM-DEXTROSE 2-3 GM-% IV SOLR
2.0000 g | Freq: Four times a day (QID) | INTRAVENOUS | Status: AC
  Administered 2013-07-16 – 2013-07-17 (×2): 2 g via INTRAVENOUS
  Filled 2013-07-16 (×3): qty 50

## 2013-07-16 MED ORDER — PHENYLEPHRINE 40 MCG/ML (10ML) SYRINGE FOR IV PUSH (FOR BLOOD PRESSURE SUPPORT)
PREFILLED_SYRINGE | INTRAVENOUS | Status: AC
  Filled 2013-07-16: qty 20

## 2013-07-16 MED ORDER — MENTHOL 3 MG MT LOZG: 1.0000 | LOZENGE | OROMUCOSAL | Status: DC | PRN

## 2013-07-16 MED ORDER — LACTATED RINGERS IV SOLN
INTRAVENOUS | Status: DC | PRN
  Administered 2013-07-16: 19:00:00 via INTRAVENOUS

## 2013-07-16 MED ORDER — POTASSIUM CHLORIDE 10 MEQ/100ML IV SOLN
10.0000 meq | INTRAVENOUS | Status: AC
  Administered 2013-07-16 (×3): 10 meq via INTRAVENOUS
  Filled 2013-07-16 (×3): qty 100

## 2013-07-16 MED ORDER — FERROUS SULFATE 325 (65 FE) MG PO TABS
325.0000 mg | ORAL_TABLET | Freq: Two times a day (BID) | ORAL | Status: DC
  Administered 2013-07-17 – 2013-07-19 (×6): 325 mg via ORAL
  Filled 2013-07-16 (×7): qty 1

## 2013-07-16 MED ORDER — PROPOFOL 10 MG/ML IV BOLUS
INTRAVENOUS | Status: AC
  Filled 2013-07-16: qty 20

## 2013-07-16 MED ORDER — ONDANSETRON HCL 4 MG PO TABS: 4.0000 mg | ORAL_TABLET | Freq: Four times a day (QID) | ORAL | Status: DC | PRN

## 2013-07-16 MED ORDER — DEXTROSE 5 % IV SOLN: 500.0000 mg | Freq: Four times a day (QID) | INTRAVENOUS | Status: DC | PRN

## 2013-07-16 MED ORDER — ACETAMINOPHEN 650 MG RE SUPP: 650.0000 mg | Freq: Four times a day (QID) | RECTAL | Status: DC | PRN

## 2013-07-16 MED ORDER — ALUM & MAG HYDROXIDE-SIMETH 200-200-20 MG/5ML PO SUSP: 30.0000 mL | ORAL | Status: DC | PRN

## 2013-07-16 MED ORDER — BUPIVACAINE IN DEXTROSE 0.75-8.25 % IT SOLN
INTRATHECAL | Status: DC | PRN
  Administered 2013-07-16: 10 mg via INTRATHECAL

## 2013-07-16 MED ORDER — PHENYLEPHRINE HCL 10 MG/ML IJ SOLN
INTRAMUSCULAR | Status: DC | PRN
  Administered 2013-07-16 (×5): 80 ug via INTRAVENOUS

## 2013-07-16 MED ORDER — FENTANYL CITRATE 0.05 MG/ML IJ SOLN: 25.0000 ug | INTRAMUSCULAR | Status: DC | PRN

## 2013-07-16 MED ORDER — PROPOFOL INFUSION 10 MG/ML OPTIME
INTRAVENOUS | Status: DC | PRN
  Administered 2013-07-16: 25 ug/kg/min via INTRAVENOUS

## 2013-07-16 MED ORDER — SODIUM CHLORIDE 0.9 % IR SOLN
Status: DC | PRN
  Administered 2013-07-16: 1000 mL

## 2013-07-16 MED ORDER — METOCLOPRAMIDE HCL 5 MG PO TABS
5.0000 mg | ORAL_TABLET | Freq: Three times a day (TID) | ORAL | Status: DC | PRN
  Filled 2013-07-16: qty 2

## 2013-07-16 MED ORDER — FENTANYL CITRATE 0.05 MG/ML IJ SOLN
INTRAMUSCULAR | Status: DC | PRN
  Administered 2013-07-16: 25 ug via INTRAVENOUS
  Administered 2013-07-16: 50 ug via INTRAVENOUS

## 2013-07-16 MED ORDER — METHOCARBAMOL 500 MG PO TABS: 500.0000 mg | ORAL_TABLET | Freq: Four times a day (QID) | ORAL | Status: DC | PRN

## 2013-07-16 MED ORDER — HYDROMORPHONE HCL PF 1 MG/ML IJ SOLN: 0.5000 mg | INTRAMUSCULAR | Status: DC | PRN

## 2013-07-16 MED ORDER — SODIUM CHLORIDE 0.9 % IV SOLN
INTRAVENOUS | Status: DC
  Administered 2013-07-16 – 2013-07-18 (×2): via INTRAVENOUS
  Filled 2013-07-16 (×5): qty 1000

## 2013-07-16 MED ORDER — ENOXAPARIN SODIUM 40 MG/0.4ML ~~LOC~~ SOLN
40.0000 mg | SUBCUTANEOUS | Status: DC
  Administered 2013-07-17 – 2013-07-19 (×3): 40 mg via SUBCUTANEOUS
  Filled 2013-07-16 (×4): qty 0.4

## 2013-07-16 SURGICAL SUPPLY — 50 items
ADH SKN CLS APL DERMABOND .7 (GAUZE/BANDAGES/DRESSINGS) ×1
BLADE 10 SAFETY STRL DISP (BLADE) ×3 IMPLANT
BLADE SAW SGTL 18X1.27X75 (BLADE) ×2 IMPLANT
BLADE SAW SGTL 18X1.27X75MM (BLADE) ×1
CAPT HIP FX BIPOLAR/UNIPOLAR ×2 IMPLANT
COVER SURGICAL LIGHT HANDLE (MISCELLANEOUS) ×3 IMPLANT
DERMABOND ADVANCED (GAUZE/BANDAGES/DRESSINGS) ×2
DERMABOND ADVANCED .7 DNX12 (GAUZE/BANDAGES/DRESSINGS) ×1 IMPLANT
DRAPE INCISE IOBAN 85X60 (DRAPES) ×3 IMPLANT
DRAPE ORTHO SPLIT 77X108 STRL (DRAPES) ×6
DRAPE SURG ORHT 6 SPLT 77X108 (DRAPES) ×2 IMPLANT
DRAPE U-SHAPE 47X51 STRL (DRAPES) ×3 IMPLANT
DRSG AQUACEL AG ADV 3.5X10 (GAUZE/BANDAGES/DRESSINGS) ×3 IMPLANT
DURAPREP 26ML APPLICATOR (WOUND CARE) ×3 IMPLANT
ELECT BLADE 4.0 EZ CLEAN MEGAD (MISCELLANEOUS) ×3
ELECT REM PT RETURN 9FT ADLT (ELECTROSURGICAL) ×3
ELECTRODE BLDE 4.0 EZ CLN MEGD (MISCELLANEOUS) ×1 IMPLANT
ELECTRODE REM PT RTRN 9FT ADLT (ELECTROSURGICAL) ×1 IMPLANT
EVACUATOR 1/8 PVC DRAIN (DRAIN) IMPLANT
FACESHIELD WRAPAROUND (MASK) ×6 IMPLANT
FACESHIELD WRAPAROUND OR TEAM (MASK) ×2 IMPLANT
GLOVE BIOGEL PI IND STRL 7.5 (GLOVE) ×1 IMPLANT
GLOVE BIOGEL PI IND STRL 8 (GLOVE) ×2 IMPLANT
GLOVE BIOGEL PI INDICATOR 7.5 (GLOVE) ×2
GLOVE BIOGEL PI INDICATOR 8 (GLOVE) ×4
GLOVE ECLIPSE 8.0 STRL XLNG CF (GLOVE) ×3 IMPLANT
GLOVE ORTHO TXT STRL SZ7.5 (GLOVE) ×3 IMPLANT
GLOVE SURG ORTHO 8.0 STRL STRW (GLOVE) ×3 IMPLANT
GOWN STRL REIN 3XL XLG LVL4 (GOWN DISPOSABLE) ×3 IMPLANT
GOWN STRL REUS W/ TWL LRG LVL3 (GOWN DISPOSABLE) ×3 IMPLANT
GOWN STRL REUS W/TWL LRG LVL3 (GOWN DISPOSABLE) ×9
HANDPIECE INTERPULSE COAX TIP (DISPOSABLE)
IMMOBILIZER KNEE 22 UNIV (SOFTGOODS) ×3 IMPLANT
KIT BASIN OR (CUSTOM PROCEDURE TRAY) ×3 IMPLANT
KIT ROOM TURNOVER OR (KITS) ×3 IMPLANT
MANIFOLD NEPTUNE II (INSTRUMENTS) ×3 IMPLANT
NS IRRIG 1000ML POUR BTL (IV SOLUTION) ×3 IMPLANT
PACK TOTAL JOINT (CUSTOM PROCEDURE TRAY) ×3 IMPLANT
PAD ARMBOARD 7.5X6 YLW CONV (MISCELLANEOUS) ×6 IMPLANT
SET HNDPC FAN SPRY TIP SCT (DISPOSABLE) IMPLANT
SPONGE LAP 4X18 X RAY DECT (DISPOSABLE) ×4 IMPLANT
SUT MNCRL AB 4-0 PS2 18 (SUTURE) IMPLANT
SUT VIC AB 1 CT1 27 (SUTURE) ×6
SUT VIC AB 1 CT1 27XBRD ANBCTR (SUTURE) ×2 IMPLANT
SUT VIC AB 2-0 CT1 27 (SUTURE)
SUT VIC AB 2-0 CT1 TAPERPNT 27 (SUTURE) IMPLANT
TOWEL OR 17X24 6PK STRL BLUE (TOWEL DISPOSABLE) ×3 IMPLANT
TOWEL OR 17X26 10 PK STRL BLUE (TOWEL DISPOSABLE) ×3 IMPLANT
TRAY FOLEY CATH 14FR (SET/KITS/TRAYS/PACK) IMPLANT
WATER STERILE IRR 1000ML POUR (IV SOLUTION) ×4 IMPLANT

## 2013-07-16 NOTE — Anesthesia Preprocedure Evaluation (Addendum)
Anesthesia Evaluation  Patient identified by MRN, date of birth, ID band Patient confused    Reviewed: Allergy & Precautions, H&P , NPO status , Patient's Chart, lab work & pertinent test results  Airway Mallampati: II TM Distance: >3 FB Neck ROM: Full    Dental  (+) Teeth Intact, Dental Advisory Given   Pulmonary neg pulmonary ROS,    Pulmonary exam normal       Cardiovascular negative cardio ROS  Rhythm:Regular Rate:Normal     Neuro/Psych PSYCHIATRIC DISORDERS  Neuromuscular disease    GI/Hepatic Neg liver ROS, hiatal hernia,   Endo/Other  negative endocrine ROS  Renal/GU negative Renal ROS     Musculoskeletal   Abdominal   Peds  Hematology  (+) Blood dyscrasia, ,   Anesthesia Other Findings Alzheimer's/Dementia  Reproductive/Obstetrics                        Anesthesia Physical Anesthesia Plan  ASA: III  Anesthesia Plan: Spinal   Post-op Pain Management:    Induction: Intravenous  Airway Management Planned: Oral ETT  Additional Equipment:   Intra-op Plan:   Post-operative Plan: Extubation in OR  Informed Consent: I have reviewed the patients History and Physical, chart, labs and discussed the procedure including the risks, benefits and alternatives for the proposed anesthesia with the patient or authorized representative who has indicated his/her understanding and acceptance.   Dental advisory given  Plan Discussed with: CRNA, Anesthesiologist and Surgeon  Anesthesia Plan Comments:       Anesthesia Quick Evaluation

## 2013-07-16 NOTE — Progress Notes (Signed)
Patient ID: Kathy [name], female   DOB: 1922-11-29, 80 y.o.   MRN: [id number]  Right femoral neck fracture Medically optimized, on [place] O2  No SOB or chest pain  NPO Consent redone due to date last signed OR tonight for right hip hemiarthroaplsty

## 2013-07-16 NOTE — Care Management Note (Unsigned)
    Page 1 of 1   07/16/2013     4:49:08 PM CARE MANAGEMENT NOTE 07/16/2013  Patient:  Kathy [name],Kathy [name]   Account Number:  [id number]  Date Initiated:  07/13/2013  Documentation initiated by:  Elmer BalesOBARGE,COURTNEY  Subjective/Objective Assessment:   Patient admitted with r femoral neck fracture, s/p fall. Required medical clearance prior to surgery.  Was found to have a PE on admission and started on an IV Heparin drip. Patient lived at home with her adult children     Action/Plan:   Will follow for discharge needs pending PT/OT evals and physician orders.   Anticipated DC Date:     Anticipated DC Plan:  SKILLED NURSING FACILITY  In-house referral  Clinical Social Worker         Choice offered to / List presented to:             Status of service:   Medicare Important Message given?  YES (If response is "NO", the following Medicare IM given date fields will be blank) Date Medicare IM given:   Date Additional Medicare IM given:  07/16/2013  Discharge Disposition:    Per UR Regulation:  Reviewed for med. necessity/level of care/duration of stay  If discussed at Long Length of Stay Meetings, dates discussed:    Comments:  07/16/13 Elmer Balesourtney Robarge RN, MSN, CM- Patient confused, with daughter at bedside.  Medicare IM letter provided.

## 2013-07-16 NOTE — Discharge Instructions (Signed)
Partial weight bearing right lower extremity until further directed  Keep wound dry, maintain surgical dressing until follow up when it will be removed

## 2013-07-16 NOTE — Op Note (Signed)
NAME:  Kathy [name]                ACCOUNT NO.:  [id number]   MEDICAL RECORD NO.: [id number]   LOCATION:  1435                         FACILITY:  [facility]   DATE OF BIRTH:  12/25/2022  PHYSICIAN:  Madlyn FrankelMatthew D. Charlann Boxerlin, M.D.     DATE OF PROCEDURE:  07/16/13                               OPERATIVE REPORT     PREOPERATIVE DIAGNOSIS:  Right displaced femoral neck fracture.   POSTOPERATIVE DIAGNOSIS:  Right displaced femoral neck fracture.   PROCEDURE:  Right hip hemiarthroplasty utilizing DePuy component, size 5 standard Summit Basic stem with a 45mm unipolar ball with a +5 adapter.   SURGEON:  Madlyn FrankelMatthew D. Charlann Boxerlin, MD   ASSISTANT: Leilani AbleSteve Chabon, PA-C.   ANESTHESIA:  Spinal.   SPECIMENS:  None.   DRAINS:  None   BLOOD LOSS:  <100 cc.   COMPLICATIONS:  None.   INDICATION OF PROCEDURE:  Kathy [name] is a pleasant 80 year old female who lives independently. She unfortunately had a fall at her house.  She was admitted to the hospital 07/11/13 after radiographs revealed a femoral neck fracture.  She was seen and evaluated and was scheduled for surgery for fixation.  Unfortunately during her initial preoperative period she developed some shortness of breath and was diagnosed with pulmonary emboli.  She was fitted with a temporary vena cava filter.  Since then she has been medically optimized weaned from facial mask O2 to Cobb O2 tolerating this well.  She was deemed cleared for right hip hemiarthroplasty.  The necessity of surgical repair was discussed with she and her family.  Consent was obtained after reviewing risks of infection, DVT, component failure, and need for revision surgery.   PROCEDURE IN DETAIL:  The patient was brought to the operative theater. Once adequate anesthesia, preoperative antibiotics, 2 g of Ancef administered, the patient was positioned into the left lateral decubitus position with the right side up.  The right lower extremity was then prepped and draped in sterile  fashion.  A time-out was performed identifying the patient, planned procedure, and extremity.   A lateral incision was made off the proximal trochanter. Sharp dissection was carried down to the iliotibial band and gluteal fascia. The gluteal fascia was then incised for posterior approach.  The short external rotators were taken down separate from the posterior capsule. An L capsulotomy was made preserving the posterior leaflet for later anatomic repair. Fracture site was identified and after removing comminuted segments of the posterior femoral neck, the femoral head was removed without difficulty and measured on the back table  using the sizing rings and determined to be 45 mm in diameter.   The proximal femur was then exposed.  Retractors placed.  I then drilled, opened the proximal femur.  Then I hand reamed once and  Irrigated the canal to try to prevent fat emboli.  I began broaching the femur with a size 1 broach up to a size 5 broach with good medial and lateral metaphyseal fit without evidence of any torsion or movement.  A trial reduction was carried out with a standard neck and a +0 then +5 adapter with a 45mm trial head ball.  The hip  reduced nicely.  The leg lengths appeared to be equal compared to the down leg.   The hip went through a range of motion without evidence of any subluxation or impingement.   Given these findings, the trial components removed.  The final 5 standard  Summit Basic stem was opened.  After irrigating the canal, the final stem was impacted and sat at the level where the broach was. Based on this and the trial reduction, a +5 adapter was opened and impacted in the 45mm unipolar ball onto a clean and dry trunnion.  The hip had been irrigated throughout the case and again at this point.  I re- Approximated the posterior capsule to the superior leaflet using a  #1 Vicryl.  The remainder of the wound was closed with #1 Vicryl in the iliotibial band and  gluteal fascia, a  2-0 Vicryl in the sub-Q tissue and a running 4-0 Monocryl in the skin.  The hip was cleaned, dried, and dressed sterilely using Dermabond and Aquacel dressing.  She was then brought to recovery room, in stable condition, tolerating the procedure well.  Leilani AbleSteve Chabon, PA-C was present and utilized as Geophysicist/field seismologistassistant for the entire case from  Preoperative positioning to management of the contralateral extremity and retractors to  General facilitation of the procedure.  He was also involved with primary wound closure.         Madlyn FrankelMatthew D. Charlann Boxerlin, M.D.

## 2013-07-16 NOTE — Anesthesia Postprocedure Evaluation (Signed)
  Anesthesia Post-op Note  Patient: Kathy [name]  Procedure(s) Performed: Procedure(s): RIGHT HEMIARTHROPLASTY HIP (Right)  Patient Location: PACU  Anesthesia Type:Spinal  Level of Consciousness: awake and alert   Airway and Oxygen Therapy: Patient Spontanous Breathing  Post-op Pain: none  Post-op Assessment: Post-op Vital signs reviewed  Post-op Vital Signs: stable  Last Vitals:  Filed Vitals:   07/16/13 2045  BP: 104/66  Pulse: 83  Temp:   Resp: 17    Complications: No apparent anesthesia complications

## 2013-07-16 NOTE — Anesthesia Procedure Notes (Addendum)
Spinal  Patient location during procedure: OR Start time: 07/16/2013 6:50 PM End time: 07/16/2013 6:58 PM Staffing Anesthesiologist: MASSAGEE, TERRY Performed by: anesthesiologist  Preanesthetic Checklist Completed: patient identified, site marked, surgical consent, pre-op evaluation, timeout performed, IV checked, risks and benefits discussed and monitors and equipment checked Spinal Block Patient position: right lateral decubitus Prep: Betadine Patient monitoring: heart rate, cardiac monitor, continuous pulse ox and blood pressure Approach: right paramedian Location: L3-4 Injection technique: single-shot Needle Needle type: Tuohy  Needle gauge: 22 G Needle length: 5 cm Needle insertion depth: 3 cm Assessment Sensory level: T6 Additional Notes Tolerated well  Procedure Name: MAC Date/Time: 07/16/2013 7:00 PM Performed by: Kathy [name], Kathy [name] Pre-anesthesia Checklist: Patient identified, Emergency Drugs available, Timeout performed, Suction available and Patient being monitored Oxygen Delivery Method: Simple face mask Placement Confirmation: positive ETCO2

## 2013-07-16 NOTE — Progress Notes (Signed)
Pt changed back to DNR postop per wishes of family. This NP spoke to daughter to verify this. Order changed. Jimmye NormanKaren Kirby-Graham, NP [facility]

## 2013-07-16 NOTE — Progress Notes (Signed)
Orthopedic Tech Progress Note Patient Details:  Kathy CraftsLouise B [name] 12/02/1922 [id number]  Ortho Devices Ortho Device/Splint Location: trapeze bar patient helper Ortho Device/Splint Interventions: Application   Kinsley Nicklaus 07/16/2013, 9:55 PM

## 2013-07-16 NOTE — Clinical Social Work Note (Signed)
CSW contacted daughter Joyce GrossKay.  Joyce GrossKay informed CSW choice of [facility]: [facility].  CSW sent referral/clinical information to SNF.  Family has been in contact with Sherron MondaySue Vaughn, admissions regarding placement and insurance information.  [facility] has been confirmed: accepts pt insurance.  CSW requested SNF to begin any authorization associated with pt insurance in anticipation dc.  Joyce GrossKay is hopeful regarding pt recovery and states ultimate goal is to have pt home with Joyce GrossKay (who is full-time caregiver).  Vickii PennaGina Alicja Everitt, LCSWA [phone]  Clinical Social Work

## 2013-07-16 NOTE — Transfer of Care (Signed)
Immediate Anesthesia Transfer of Care Note  Patient: Kathy [name]  Procedure(s) Performed: Procedure(s): RIGHT HEMIARTHROPLASTY HIP (Right)  Patient Location: PACU  Anesthesia Type:General  Level of Consciousness: awake, alert  and oriented  Airway & Oxygen Therapy: Patient Spontanous Breathing and Patient connected to nasal cannula oxygen  Post-op Assessment: Report given to PACU RN, Post -op Vital signs reviewed and stable and moving upper extremities. Sensation at L1   Post vital signs: Reviewed and stable  Complications: No apparent anesthesia complications

## 2013-07-16 NOTE — Progress Notes (Signed)
[facility] PROGRESS NOTE  Kathy CraftsLouise B [name] EAV:[id number] DOB: Apr 29, 1922 DOA: 07/11/2013 PCP: Junious SilkALTHEIMER,MICHAEL D, MD  Assessment/Plan: Bilateral PE -CT angiogram last p.m. positive for bilateral PEs and patient started on full dose heparin -I have consulted CCM/pulmonology for surgical clearance in this patient requiring surgery in setting of acute PE>> Dr.Yacoub  saw patient and recommended IVC filter which was placed on 5/1 -Pulmonary also recommended to discontinue IV heparin after her filter was placed and this was done -DVT prophylaxis deferred to orthopedics -She remains on nasal cannula oxygen at this time with O2 sats in the 90s Right hip fracture, status post Mechanical fall  -Per orthopedics, surgery today 5/4 -As above  DVT prophylaxis deferred to orthopedics Fever/probable  UTI  -Continue Rocephin, she remains afebrile and hemodynamically stable -blood and urine cultures remained with no growth to date Dementia  -Continue her outpatient medication Hypothyroidism  -Continue Synthroid Hypokalemia -k replaced follow recheck  Code Status: DO NOT RESUSCITATE Family Communication: None at bedside Disposition Plan: Pending clinical course   Consultants:  Orthopedics  pulmonology  Procedures:  IVC placement today  Antibiotics:  Rocephin started on 4/30  HPI/Subjective: Alert, denies any complaints. Objective: Filed Vitals:   07/16/13 0515  BP: 116/59  Pulse: 73  Temp: 98.1 F (36.7 C)  Resp: 20    Intake/Output Summary (Last 24 hours) at 07/16/13 1340 Last data filed at 07/16/13 0900  Gross per 24 hour  Intake    240 ml  Output      0 ml  Net    240 ml   Filed Weights   07/12/13 0042  Weight: 47.356 kg (104 lb 6.4 oz)    Exam:  General: alert & disoriented-answers simple questions appropriately In NAD Cardiovascular: RRR, nl S1 s2 Respiratory: CTAB Abdomen: soft +BS NT/ND, no masses palpable Extremities: No cyanosis and no  edema    Data Reviewed: Basic Metabolic Panel:  Recent Labs Lab 07/12/13 0147 07/13/13 0500 07/14/13 0433 07/15/13 0712 07/16/13 0400  NA 144 144 140 144 142  K 4.2 4.0 3.6* 3.3* 3.5*  CL 105 105 101 103 105  CO2 26 29 30 28 24   GLUCOSE 123* 116* 122* 116* 104*  BUN 10 13 10 9 10   CREATININE 0.77 0.83 0.65 0.64 0.62  CALCIUM 8.9 8.5 8.5 8.3* 8.5   Liver Function Tests:  Recent Labs Lab 07/12/13 0147 07/13/13 0500 07/14/13 0433 07/15/13 0712 07/16/13 0400  AST 15 13 15 16 17   ALT 10 9 8 13 9   ALKPHOS 97 88 93 80 74  BILITOT 0.7 1.1 1.1 1.0 0.7  PROT 6.5 6.0 6.3 5.8* 5.8*  ALBUMIN 3.1* 2.8* 2.6* 2.4* 2.3*   No results found for this basename: LIPASE, AMYLASE,  in the last 168 hours No results found for this basename: AMMONIA,  in the last 168 hours CBC:  Recent Labs Lab 07/12/13 0147 07/13/13 0500 07/13/13 0840 07/14/13 0433 07/15/13 0712 07/16/13 0400  WBC 10.3 9.3 9.7 9.1 7.2 7.3  NEUTROABS 8.0* 6.5  --  6.9 5.1 4.5  HGB 13.1 13.0 13.0 13.5 12.7 13.2  HCT 41.1 41.3 41.7 42.1 40.4 41.6  MCV 100.0 101.2* 101.5* 99.5 98.8 99.5  PLT 200 184 169 188 201 210   Cardiac Enzymes:  Recent Labs Lab 07/12/13 1515 07/12/13 2300 07/13/13 0500  TROPONINI <0.30 <0.30 <0.30   BNP (last 3 results) No results found for this basename: PROBNP,  in the last 8760 hours CBG: No results found for this basename:  GLUCAP,  in the last 168 hours  Recent Results (from the past 240 hour(s))  URINE CULTURE     Status: None   Collection Time    07/12/13  4:13 PM      Result Value Ref Range Status   Specimen Description URINE, CATHETERIZED   Final   Special Requests NONE   Final   Culture  Setup Time     Final   Value: 07/12/2013 17:02     Performed at [facility]   Colony Count     Final   Value: NO GROWTH     Performed at [facility]   Culture     Final   Value: NO GROWTH     Performed at [facility]   Report Status 07/13/2013 FINAL    Final  CULTURE, BLOOD (ROUTINE X 2)     Status: None   Collection Time    07/12/13  4:26 PM      Result Value Ref Range Status   Specimen Description BLOOD RIGHT HAND   Final   Special Requests BOTTLES DRAWN AEROBIC ONLY 3CC   Final   Culture  Setup Time     Final   Value: 07/12/2013 20:55     Performed at [facility]   Culture     Final   Value:        BLOOD CULTURE RECEIVED NO GROWTH TO DATE CULTURE WILL BE HELD FOR 5 DAYS BEFORE ISSUING A FINAL NEGATIVE REPORT     Performed at [facility]   Report Status PENDING   Incomplete  CULTURE, BLOOD (ROUTINE X 2)     Status: None   Collection Time    07/12/13  4:40 PM      Result Value Ref Range Status   Specimen Description BLOOD LEFT HAND   Final   Special Requests BOTTLES DRAWN AEROBIC ONLY 4CC   Final   Culture  Setup Time     Final   Value: 07/12/2013 20:55     Performed at [facility]   Culture     Final   Value:        BLOOD CULTURE RECEIVED NO GROWTH TO DATE CULTURE WILL BE HELD FOR 5 DAYS BEFORE ISSUING A FINAL NEGATIVE REPORT     Performed at [facility]   Report Status PENDING   Incomplete  SURGICAL PCR SCREEN     Status: None   Collection Time    07/15/13 11:16 PM      Result Value Ref Range Status   MRSA, PCR NEGATIVE  NEGATIVE Final   Staphylococcus aureus NEGATIVE  NEGATIVE Final   Comment:            The Xpert SA Assay (FDA     approved for NASAL specimens     in patients over 78 years of age),     is one component of     a comprehensive surveillance     program.  Test performance has     been validated by [facility] for patients greater     than or equal to 78 year old.     It is not intended     to diagnose infection nor to     guide or monitor treatment.     Studies: No results found.  Scheduled Meds: . aspirin EC  81 mg Oral Daily  . cefTRIAXone (ROCEPHIN)  IV  1 g  Intravenous Daily  . donepezil  10 mg Oral Daily  . levothyroxine  88 mcg Oral QAC breakfast   . Memantine HCl ER  28 mg Oral Daily  . vitamin B-12  1,000 mcg Oral Daily  . Vitamin D (Ergocalciferol)  50,000 Units Oral Q30 days   Continuous Infusions: . sodium chloride 30 mL/hr at 07/16/13 1610    Active Problems:   Femur fracture, right   Femoral neck fracture   Acute pulmonary embolism   End of life care    Time spent: 25    Kela Millin  [facility] Pager [phone]. If 7PM-7AM, please contact night-coverage at [email or web address], password [facility] 07/16/2013, 1:40 PM  LOS: 5 days

## 2013-07-17 ENCOUNTER — Encounter (HOSPITAL_COMMUNITY): Payer: Self-pay | Admitting: Orthopedic Surgery

## 2013-07-17 DIAGNOSIS — S72009A Fracture of unspecified part of neck of unspecified femur, initial encounter for closed fracture: Secondary | ICD-10-CM

## 2013-07-17 LAB — CBC WITH DIFFERENTIAL/PLATELET
Basophils Absolute: 0 10*3/uL (ref 0.0–0.1)
Basophils Relative: 0 % (ref 0–1)
EOS ABS: 0.1 10*3/uL (ref 0.0–0.7)
Eosinophils Relative: 2 % (ref 0–5)
HCT: 39.1 % (ref 36.0–46.0)
HEMOGLOBIN: 12.6 g/dL (ref 12.0–15.0)
LYMPHS ABS: 1.1 10*3/uL (ref 0.7–4.0)
LYMPHS PCT: 15 % (ref 12–46)
MCH: 31.5 pg (ref 26.0–34.0)
MCHC: 32.2 g/dL (ref 30.0–36.0)
MCV: 97.8 fL (ref 78.0–100.0)
MONOS PCT: 8 % (ref 3–12)
Monocytes Absolute: 0.6 10*3/uL (ref 0.1–1.0)
NEUTROS ABS: 5.6 10*3/uL (ref 1.7–7.7)
NEUTROS PCT: 75 % (ref 43–77)
PLATELETS: 216 10*3/uL (ref 150–400)
RBC: 4 MIL/uL (ref 3.87–5.11)
RDW: 14.1 % (ref 11.5–15.5)
WBC: 7.5 10*3/uL (ref 4.0–10.5)

## 2013-07-17 LAB — COMPREHENSIVE METABOLIC PANEL
ALT: 12 U/L (ref 0–35)
AST: 25 U/L (ref 0–37)
Albumin: 2.5 g/dL — ABNORMAL LOW (ref 3.5–5.2)
Alkaline Phosphatase: 72 U/L (ref 39–117)
BUN: 11 mg/dL (ref 6–23)
CALCIUM: 8.6 mg/dL (ref 8.4–10.5)
CO2: 26 meq/L (ref 19–32)
CREATININE: 0.57 mg/dL (ref 0.50–1.10)
Chloride: 104 mEq/L (ref 96–112)
GFR calc non Af Amer: 80 mL/min — ABNORMAL LOW (ref >90)
Glucose, Bld: 106 mg/dL — ABNORMAL HIGH (ref 70–99)
Potassium: 3.8 mEq/L (ref 3.7–5.3)
Sodium: 142 mEq/L (ref 137–147)
Total Bilirubin: 0.7 mg/dL (ref 0.3–1.2)
Total Protein: 5.9 g/dL — ABNORMAL LOW (ref 6.0–8.3)

## 2013-07-17 MED ORDER — BOOST / RESOURCE BREEZE PO LIQD
1.0000 | Freq: Two times a day (BID) | ORAL | Status: DC
  Administered 2013-07-17: 1 via ORAL

## 2013-07-17 MED ORDER — ACETAMINOPHEN 325 MG PO TABS
650.0000 mg | ORAL_TABLET | Freq: Four times a day (QID) | ORAL | Status: DC | PRN
  Administered 2013-07-17 – 2013-07-19 (×7): 650 mg via ORAL

## 2013-07-17 MED ORDER — TRAMADOL HCL 50 MG PO TABS: 50.0000 mg | ORAL_TABLET | Freq: Four times a day (QID) | ORAL | Status: DC | PRN

## 2013-07-17 MED ORDER — ACETAMINOPHEN 325 MG PO TABS: 650.0000 mg | ORAL_TABLET | Freq: Four times a day (QID) | ORAL | Status: AC | PRN

## 2013-07-17 NOTE — Progress Notes (Signed)
NUTRITION FOLLOW UP  Intervention:   Provide Magic Cup ice cream with lunch and dinner Provide snacks BID  Nutrition Dx:   Inadequate oral intake related to dementia and decreased appetite as evidenced by 18% weight loss in the past year; ongoing  Goal:   Pt to meet >/= 90% of their estimated nutrition needs; currently unmet  Monitor:   Diet advancement, PO intake, weight trend, labs  Assessment:   80 y.o. Female with history of dementia. Patient fell getting out of bed, sustaining a right femoral neck fracture [facility] Dr Charlann Boxerlin consulted for orthopaedic management of injury.  4/30: Pt with advanced dementia and asleep at time of visit. Per daughter at bedside pt eats like a bird and has lost 26 lbs in the past year but, pt's doctor told daughter weight loss was expected with dementia. Daughter states pt eats 3 meals daily and she provides pt with snacks in between meals; pt does not drink nutritional supplements because they upset her stomach but, she does drink Lactose Free milk.  Weight history shows pt has lost 18% of her body weight in the past year.   5/5:  1 Day Post-Op Procedure(s) (LRB):  RIGHT HEMIARTHROPLASTY HIP (Right) Pt's diet advanced to Dys 3 on 5/1 but, back to NPO on 5/4. Today pt is on a regular diet. Per nursing notes, pt has been consuming 50% of most meals, 10-15% of a few meals. Per daughter at bedside, pt is eating well but would benefit from her usual routine of receiving snacks in between meals- RD to order. No new weight.  Labs reviewed.   Height: Ht Readings from Last 1 Encounters:  07/12/13 5\' 3"  (1.6 m)    Weight Status:   Wt Readings from Last 1 Encounters:  07/12/13 104 lb 6.4 oz (47.356 kg)    Re-estimated needs:  Kcal: 1300-1500  Protein: 55-65 grams  Fluid: 1.4 L/day  Skin: closed incision on right hip; non-pitting RLE edema, +1 LLE edema  Diet Order: General   Intake/Output Summary (Last 24 hours) at  07/17/13 1554 Last data filed at 07/17/13 1215  Gross per 24 hour  Intake   1280 ml  Output      0 ml  Net   1280 ml    Last BM: 5/5   Labs:   Recent Labs Lab 07/15/13 0712 07/16/13 0400 07/17/13 0546  NA 144 142 142  K 3.3* 3.5* 3.8  CL 103 105 104  CO2 28 24 26   BUN 9 10 11   CREATININE 0.64 0.62 0.57  CALCIUM 8.3* 8.5 8.6  GLUCOSE 116* 104* 106*    CBG (last 3)  No results found for this basename: GLUCAP,  in the last 72 hours  Scheduled Meds: . aspirin EC  81 mg Oral Daily  . cefTRIAXone (ROCEPHIN)  IV  1 g Intravenous Daily  . docusate sodium  100 mg Oral BID  . donepezil  10 mg Oral Daily  . enoxaparin (LOVENOX) injection  40 mg Subcutaneous Q24H  . ferrous sulfate  325 mg Oral BID WC  . levothyroxine  88 mcg Oral QAC breakfast  . Memantine HCl ER  28 mg Oral Daily  . vitamin B-12  1,000 mcg Oral Daily  . Vitamin D (Ergocalciferol)  50,000 Units Oral Q30 days    Continuous Infusions: . sodium chloride 500 mL (07/16/13 1542)  . lactated ringers 50 mL/hr (07/16/13 1734)  . sodium chloride 0.9 % 1,000 mL with potassium chloride  10 mEq infusion 50 mL/hr at 07/16/13 2312    Ian Malkineanne Barnett RD, LDN Inpatient Clinical Dietitian Pager: [phone] After Hours Pager: [phone]

## 2013-07-17 NOTE — Evaluation (Signed)
Physical Therapy Evaluation Patient Details Name: Kathy [name] MRN: [id number] DOB: 05/29/1922 Today's Date: 07/17/2013   History of Present Illness  Pt admitted after fall 4/29 sustaining proximal femur fx, s/p R hip hemiarthroplasty.  CT also showed Bil PE, pt s/p IVC filter placement   Clinical Impression  Pt admitted with/for femur repair after fall.  Pt currently limited functionally due to the problems listed. ( See problems list.)   Pt will benefit from PT to maximize function and safety in order to get ready for next venue listed below.     Follow Up Recommendations SNF    Equipment Recommendations  None recommended by PT    Recommendations for Other Services       Precautions / Restrictions Precautions Precautions: Posterior Hip;Fall Required Braces or Orthoses: Knee Immobilizer - Right (when in bed) Restrictions Weight Bearing Restrictions: Yes RLE Weight Bearing: Partial weight bearing RLE Partial Weight Bearing Percentage or Pounds: 50      Mobility  Bed Mobility Overal bed mobility: Needs Assistance Bed Mobility: Supine to Sit     Supine to sit: Max assist     General bed mobility comments: v/tc's for  guidance through tasks.  significant truncal assist  Transfers Overall transfer level: Needs assistance Equipment used: Rolling walker (2 wheeled) Transfers: Sit to/from [employer];Stand Pivot Transfers Sit to Stand: Max assist;+2 physical assistance Stand pivot transfers: Max assist;+2 safety/equipment       General transfer comment: pt is resistant because of fear and ?pain.  significant lifting assist  Ambulation/Gait                Stairs            Wheelchair Mobility    Modified Rankin (Stroke Patients Only)       Balance Overall balance assessment: Needs assistance Sitting-balance support: Feet supported;Single extremity supported Sitting balance-Leahy Scale: Poor (with short periods of fair) Sitting balance - Comments:  lists posteriorly with challenge   Standing balance support: Bilateral upper extremity supported;During functional activity Standing balance-Leahy Scale: Poor                               Pertinent Vitals/Pain      Home Living Family/patient expects to be discharged to:: Skilled nursing facility   Available Help at Discharge: Family           Home Equipment: Dan HumphreysWalker - 2 wheels;Cane - quad;Bedside commode;Shower seat;Wheelchair - manual      Prior Function Level of Independence: Needs assistance   Gait / Transfers Assistance Needed: walked with short shuffle steps for short distances without assist  ADL's / Homemaking Assistance Needed: dep        Hand Dominance        Extremity/Trunk Assessment   Upper Extremity Assessment: Defer to OT evaluation           Lower Extremity Assessment: Generalized weakness;RLE deficits/detail RLE Deficits / Details: stiff with pain       Communication   Communication: No difficulties  Cognition Arousal/Alertness: Awake/alert Behavior During Therapy: Restless Overall Cognitive Status: History of cognitive impairments - at baseline                      General Comments      Exercises        Assessment/Plan    PT Assessment Patient needs continued PT services  PT Diagnosis Abnormality of gait;Generalized weakness;Acute pain  PT Problem List Decreased strength;Decreased range of motion;Decreased balance;Decreased activity tolerance;Decreased mobility;Decreased cognition;Decreased knowledge of use of DME;Decreased knowledge of precautions;Pain  PT Treatment Interventions DME instruction;Gait training;Functional mobility training;Therapeutic activities;Therapeutic exercise;Patient/family education   PT Goals (Current goals can be found in the Care Plan section) Acute Rehab PT Goals Patient Stated Goal: pt unable to participate PT Goal Formulation: With patient/family Time For Goal Achievement:  07/24/13 Potential to Achieve Goals: Fair    Frequency Min 3X/week   Barriers to discharge        Co-evaluation               End of Session   Activity Tolerance: Patient limited by pain;Other (comment);Patient tolerated treatment well (and fears) Patient left: in chair;with family/visitor present Nurse Communication: Mobility status;Precautions;Weight bearing status         Time: 1138-1206 PT Time Calculation (min): 28 min   Charges:   PT Evaluation $Initial PT Evaluation Tier I: 1 Procedure PT Treatments $Gait Training: 8-22 mins $Therapeutic Activity: 8-22 mins   PT G CodesEliseo [name]:          Kathy [name] 07/17/2013, 12:47 PM

## 2013-07-17 NOTE — Progress Notes (Addendum)
[facility] PROGRESS NOTE  Kathy [name] ZOX:[id number] DOB: 03/14/1923 DOA: 07/11/2013 PCP: Junious SilkALTHEIMER,MICHAEL D, MD Brief Narrative Pt is an 80 y.o. year old female with h/o dementia, HLD presenting with R femoral neck fracture s/p mechanical fall.Ortho was consulted on admission. She was found to be hypoxic in the  Hospital on 4/30 and w/u revealed PE>>pulm was consulted and IVC filter placed on 5/1 and pt was cleared for surgery which she ha done on 5/4 Assessment/Plan: Bilateral PE -CT angiogram last p.m. positive for bilateral PEs and patient started on full dose heparin -I have consulted CCM/pulmonology for surgical clearance in this patient requiring surgery in setting of acute PE>> Dr.Yacoub  saw patient and recommended IVC filter which was placed on 5/1 -Pulmonary also recommended to discontinue IV heparin after her filter was placed and this was done -longterrm anticoagulation not recommended per pulmonary given her h/o gait disturbance and this admission s/p fall -DVT prophylaxis deferred to orthopedics -nasal cannula oxygen being weaned and so far O2 sats in the 90s on room air>> follow Right hip fracture, status post Mechanical fall  -Per orthopedics, surgery 5/4 -As above  DVT prophylaxis deferred to orthopedics -SNF okayed from ortho standpoint, SW assisting with placement Fever/probable  UTI  -will d/c Rocephin, she has completed antibiotic course and remains afebrile and hemodynamically stable -blood and urine cultures remained with no growth to date Dementia  -Continue her outpatient medication Hypothyroidism  -Continue Synthroid Hypokalemia -resolved  Code Status: DO NOT RESUSCITATE Family Communication: None at bedside Disposition Plan: SNF   Consultants:  Orthopedics  pulmonology  Procedures:  IVC placement 5/1  Antibiotics:  Rocephin started on 4/30>>5/5  HPI/Subjective: Per daughter pt did not sleep last pm. Pt alert and denies any  complaints Objective: Filed Vitals:   07/17/13 1425  BP: 92/50  Pulse: 89  Temp: 97.7 F (36.5 C)  Resp: 18    Intake/Output Summary (Last 24 hours) at 07/17/13 1724 Last data filed at 07/17/13 1215  Gross per 24 hour  Intake   1280 ml  Output      0 ml  Net   1280 ml   Filed Weights   07/12/13 0042  Weight: 47.356 kg (104 lb 6.4 oz)    Exam:  General: alert & disoriented-answers simple questions appropriately In NAD Cardiovascular: RRR, nl S1 s2 Respiratory: CTAB Abdomen: soft +BS NT/ND, no masses palpable Extremities: No cyanosis and no edema    Data Reviewed: Basic Metabolic Panel:  Recent Labs Lab 07/13/13 0500 07/14/13 0433 07/15/13 0712 07/16/13 0400 07/17/13 0546  NA 144 140 144 142 142  K 4.0 3.6* 3.3* 3.5* 3.8  CL 105 101 103 105 104  CO2 29 30 28 24 26   GLUCOSE 116* 122* 116* 104* 106*  BUN 13 10 9 10 11   CREATININE 0.83 0.65 0.64 0.62 0.57  CALCIUM 8.5 8.5 8.3* 8.5 8.6   Liver Function Tests:  Recent Labs Lab 07/13/13 0500 07/14/13 0433 07/15/13 0712 07/16/13 0400 07/17/13 0546  AST 13 15 16 17 25   ALT 9 8 13 9 12   ALKPHOS 88 93 80 74 72  BILITOT 1.1 1.1 1.0 0.7 0.7  PROT 6.0 6.3 5.8* 5.8* 5.9*  ALBUMIN 2.8* 2.6* 2.4* 2.3* 2.5*   No results found for this basename: LIPASE, AMYLASE,  in the last 168 hours No results found for this basename: AMMONIA,  in the last 168 hours CBC:  Recent Labs Lab 07/13/13 0500 07/13/13 0840 07/14/13 0433 07/15/13 0712 07/16/13 0400  07/17/13 0546  WBC 9.3 9.7 9.1 7.2 7.3 7.5  NEUTROABS 6.5  --  6.9 5.1 4.5 5.6  HGB 13.0 13.0 13.5 12.7 13.2 12.6  HCT 41.3 41.7 42.1 40.4 41.6 39.1  MCV 101.2* 101.5* 99.5 98.8 99.5 97.8  PLT 184 169 188 201 210 216   Cardiac Enzymes:  Recent Labs Lab 07/12/13 1515 07/12/13 2300 07/13/13 0500  TROPONINI <0.30 <0.30 <0.30   BNP (last 3 results) No results found for this basename: PROBNP,  in the last 8760 hours CBG: No results found for this basename:  GLUCAP,  in the last 168 hours  Recent Results (from the past 240 hour(s))  URINE CULTURE     Status: None   Collection Time    07/12/13  4:13 PM      Result Value Ref Range Status   Specimen Description URINE, CATHETERIZED   Final   Special Requests NONE   Final   Culture  Setup Time     Final   Value: 07/12/2013 17:02     Performed at [facility] Count     Final   Value: NO GROWTH     Performed at [facility]   Culture     Final   Value: NO GROWTH     Performed at [facility]   Report Status 07/13/2013 FINAL   Final  CULTURE, BLOOD (ROUTINE X 2)     Status: None   Collection Time    07/12/13  4:26 PM      Result Value Ref Range Status   Specimen Description BLOOD RIGHT HAND   Final   Special Requests BOTTLES DRAWN AEROBIC ONLY 3CC   Final   Culture  Setup Time     Final   Value: 07/12/2013 20:55     Performed at [facility]   Culture     Final   Value:        BLOOD CULTURE RECEIVED NO GROWTH TO DATE CULTURE WILL BE HELD FOR 5 DAYS BEFORE ISSUING A FINAL NEGATIVE REPORT     Performed at [facility]   Report Status PENDING   Incomplete  CULTURE, BLOOD (ROUTINE X 2)     Status: None   Collection Time    07/12/13  4:40 PM      Result Value Ref Range Status   Specimen Description BLOOD LEFT HAND   Final   Special Requests BOTTLES DRAWN AEROBIC ONLY 4CC   Final   Culture  Setup Time     Final   Value: 07/12/2013 20:55     Performed at [facility]   Culture     Final   Value:        BLOOD CULTURE RECEIVED NO GROWTH TO DATE CULTURE WILL BE HELD FOR 5 DAYS BEFORE ISSUING A FINAL NEGATIVE REPORT     Performed at [facility]   Report Status PENDING   Incomplete  SURGICAL PCR SCREEN     Status: None   Collection Time    07/15/13 11:16 PM      Result Value Ref Range Status   MRSA, PCR NEGATIVE  NEGATIVE Final   Staphylococcus aureus NEGATIVE  NEGATIVE Final   Comment:            The Xpert SA Assay (FDA      approved for NASAL specimens     in patients over 50 years of age),     is one  component of     a comprehensive surveillance     program.  Test performance has     been validated by [facility] for patients greater     than or equal to 78 year old.     It is not intended     to diagnose infection nor to     guide or monitor treatment.     Studies: Dg Pelvis Portable  07/17/2013   CLINICAL DATA:  Postop hip replacement.  EXAM: PORTABLE PELVIS 1-2 VIEWS  COMPARISON:  07/11/2013  FINDINGS: Imaged portions of a bipolar right hip hemiarthroplasty are unremarkable. Expected postoperative gas around the right hip. Negative for pelvic ring or left hip fracture. IVC filter.  IMPRESSION: Imaged portions of recent right hip hemiarthroplasty are unremarkable.   Electronically Signed   By: Tiburcio PeaJonathan  Watts M.D.   On: 07/17/2013 05:48    Scheduled Meds: . aspirin EC  81 mg Oral Daily  . cefTRIAXone (ROCEPHIN)  IV  1 g Intravenous Daily  . docusate sodium  100 mg Oral BID  . donepezil  10 mg Oral Daily  . enoxaparin (LOVENOX) injection  40 mg Subcutaneous Q24H  . ferrous sulfate  325 mg Oral BID WC  . levothyroxine  88 mcg Oral QAC breakfast  . Memantine HCl ER  28 mg Oral Daily  . vitamin B-12  1,000 mcg Oral Daily  . Vitamin D (Ergocalciferol)  50,000 Units Oral Q30 days   Continuous Infusions: . sodium chloride 500 mL (07/16/13 1542)  . lactated ringers 50 mL/hr (07/16/13 1734)  . sodium chloride 0.9 % 1,000 mL with potassium chloride 10 mEq infusion 50 mL/hr at 07/16/13 2312    Active Problems:   Femur fracture, right   Femoral neck fracture   Acute pulmonary embolism   End of life care    Time spent: 25    Kela MillinAdeline C Mkayla Steele  [facility] Pager [phone]. If 7PM-7AM, please contact night-coverage at [email or web address], password [email or web address] 07/17/2013, 5:24 PM  LOS: 6 days

## 2013-07-17 NOTE — Progress Notes (Signed)
OT Cancellation Note  Patient Details Name: Kathy [name] MRN: [id number] DOB: [birth year]   Cancelled Treatment:    Reason Eval/Treat Not Completed: OT screened. Pt current D/C plan is SNF. No apparent immediate acute care OT needs, therefore will defer OT to SNF. If OT eval is needed please call Acute Rehab Dept. at [phone] or text page OT at [phone].    Earlie RavelingLindsey L Haide Klinker OTR/L [phone] 07/17/2013, 10:15 AM

## 2013-07-17 NOTE — Progress Notes (Signed)
Patient ID: Rocky CraftsLouise B Counts, female   DOB: 04-19-1922, 80 y.o.   MRN: [id number] Subjective: 1 Day Post-Op Procedure(s) (LRB): RIGHT HEMIARTHROPLASTY HIP (Right)    Patient reports pain as mild.  No events, but up all night.  Baseline unchanged  pleasant dementia  Objective:   VITALS:   Filed Vitals:   07/17/13 0400  BP:   Pulse:   Temp:   Resp: 18    Neurovascular intact Incision: dressing C/D/I  LABS  Recent Labs  07/15/13 0712 07/16/13 0400  HGB 12.7 13.2  HCT 40.4 41.6  WBC 7.2 7.3  PLT 201 210     Recent Labs  07/15/13 0712 07/16/13 0400  NA 144 142  K 3.3* 3.5*  BUN 9 10  CREATININE 0.64 0.62  GLUCOSE 116* 104*    No results found for this basename: LABPT, INR,  in the last 72 hours   Assessment/Plan: 1 Day Post-Op Procedure(s) (LRB): RIGHT HEMIARTHROPLASTY HIP (Right)   Up with therapy Discharge to SNF when medically stable  Tylenol for pain Tramadol for breakthrough pain

## 2013-07-18 LAB — CBC WITH DIFFERENTIAL/PLATELET
BASOS ABS: 0 10*3/uL (ref 0.0–0.1)
Basophils Relative: 0 % (ref 0–1)
Eosinophils Absolute: 0.3 10*3/uL (ref 0.0–0.7)
Eosinophils Relative: 3 % (ref 0–5)
HEMATOCRIT: 34.8 % — AB (ref 36.0–46.0)
HEMOGLOBIN: 11 g/dL — AB (ref 12.0–15.0)
LYMPHS PCT: 17 % (ref 12–46)
Lymphs Abs: 1.4 10*3/uL (ref 0.7–4.0)
MCH: 30.6 pg (ref 26.0–34.0)
MCHC: 31.6 g/dL (ref 30.0–36.0)
MCV: 96.9 fL (ref 78.0–100.0)
MONOS PCT: 8 % (ref 3–12)
Monocytes Absolute: 0.7 10*3/uL (ref 0.1–1.0)
NEUTROS ABS: 6.1 10*3/uL (ref 1.7–7.7)
Neutrophils Relative %: 72 % (ref 43–77)
Platelets: 217 10*3/uL (ref 150–400)
RBC: 3.59 MIL/uL — ABNORMAL LOW (ref 3.87–5.11)
RDW: 14.4 % (ref 11.5–15.5)
WBC: 8.5 10*3/uL (ref 4.0–10.5)

## 2013-07-18 LAB — COMPREHENSIVE METABOLIC PANEL WITH GFR
ALT: 12 U/L (ref 0–35)
AST: 26 U/L (ref 0–37)
Albumin: 2.1 g/dL — ABNORMAL LOW (ref 3.5–5.2)
Alkaline Phosphatase: 71 U/L (ref 39–117)
BUN: 12 mg/dL (ref 6–23)
CO2: 26 meq/L (ref 19–32)
Calcium: 8.5 mg/dL (ref 8.4–10.5)
Chloride: 105 meq/L (ref 96–112)
Creatinine, Ser: 0.75 mg/dL (ref 0.50–1.10)
GFR calc Af Amer: 85 mL/min — ABNORMAL LOW
GFR calc non Af Amer: 73 mL/min — ABNORMAL LOW
Glucose, Bld: 121 mg/dL — ABNORMAL HIGH (ref 70–99)
Potassium: 3.3 meq/L — ABNORMAL LOW (ref 3.7–5.3)
Sodium: 144 meq/L (ref 137–147)
Total Bilirubin: 0.6 mg/dL (ref 0.3–1.2)
Total Protein: 5.3 g/dL — ABNORMAL LOW (ref 6.0–8.3)

## 2013-07-18 LAB — CULTURE, BLOOD (ROUTINE X 2)
CULTURE: NO GROWTH
Culture: NO GROWTH

## 2013-07-18 MED ORDER — POTASSIUM CHLORIDE CRYS ER 10 MEQ PO TBCR
10.0000 meq | EXTENDED_RELEASE_TABLET | Freq: Two times a day (BID) | ORAL | Status: DC
  Administered 2013-07-18 – 2013-07-19 (×3): 10 meq via ORAL
  Filled 2013-07-18 (×5): qty 1

## 2013-07-18 MED ORDER — ENSURE COMPLETE PO LIQD: 237.0000 mL | Freq: Two times a day (BID) | ORAL | Status: DC

## 2013-07-18 NOTE — Discharge Summary (Addendum)
Physician Discharge Summary  Kathy [name] ZOX:[id number] DOB: 10/27/1922 DOA: 07/11/2013  PCP: Junious Silk, MD  Admit date: 07/11/2013 Discharge date: 07/19/2013  Recommendations for Outpatient Follow-up:  1. Palliative care to follow 2. Ongoing PT/OT 3. Lovenox through 08/12/13, then stop 4. F/u with orthopedic surgery, Dr. Charlann Boxer, in 2 weeks 5. Repeat BMP and CBC in 1 week 6. Continue potassium supplementation for 5 days, then stop.    Discharge Diagnoses:  Principal Problem:   Femur fracture, right Active Problems:   Alzheimer's disease   Femoral neck fracture   Acute pulmonary embolism   End of life care   Acute respiratory failure with hypoxia   Hypokalemia   Normocytic anemia due to blood loss   Discharge Condition: stable, improved  Diet recommendation: regular  Wt Readings from Last 3 Encounters:  07/12/13 47.356 kg (104 lb 6.4 oz)  07/12/13 47.356 kg (104 lb 6.4 oz)  07/12/13 47.356 kg (104 lb 6.4 oz)    History of present illness:  This is a 80 y.o. year old female with prior hx/o end stage dementia, HLD presenting with R femoral neck fracture s/p mechanical fall. Primary history is from daughter. Daughter states that pt was getting out of bed when she slipped, accidentally falling on R hip. Pt had severe pain after fall.  Presented to ER with pain. Had R hip xray that showed displaced R femoral neck fracture. Ortho consulted in ER. Plan for surgical repair in am. Medical admission and pain control in the interim.   Hospital Course:   Right hip fracture status post mechanical fall. She underwent right hemiarthroplasty on 5/4 by Doctor Charlann Boxer.  She should have 50% weightbearing on the right lower extremity with posterior hip precautions. She is being given a prescription for tramadol for breakthrough pain. Recommend leaving dressing clean, dry, and intact until she follows up with orthopedic surgery. She should continue Lovenox once daily for DVT prophylaxis for  30 days, then stop.  Last day is 08/12/13.  Her hospital course was complicated by acute hypoxic respiratory failure secondary to bilateral subsegmental pulmonary emboli. A rapid response was called because she developed acute onset of fever and shortness of breath with hypoxia on 4/30. Although she been ordered subcutaneous heparin, it had not been administered because of anticipated surgery. She was started on heparin infusion and pulmonary critical care with consult. They recommended placing an IVC filter because she has history of deep disturbance and frequent falls including the one leading to this admission. They recommended against long-term anticoagulation for this reason.  IVC filter placed on 5/1.    Fever, likely secondary to pulmonary embolism. Blood and urine cultures remained no growth to date. She continued a 6 day course of ceftriaxone for possible urinary tract infection.  Dementia, stable, continued her outpatient regimen.  Hypothyroidism, stable, continued Synthroid.  Hypokalemia, recurrent, started potassium supplementation for 5 days. She should have repeat BMP done approximately one week.    Acute blood loss anemia secondary to surgery. She was started on iron supplementation.  Consultants:  Dr. Luna Fuse, Orthopedic surgery Inteventional Radiology Pulmonary Critical Care Procedures:  right hemiarthroplasty on 5/4 by Dr. Charlann Boxer  IVC filter placed on 5/1 Antibiotics:  Ceftriaxone x 6 days   Discharge Exam: Filed Vitals:   07/19/13 0800  BP:   Pulse:   Temp:   Resp: 18   Filed Vitals:   07/18/13 2142 07/19/13 0113 07/19/13 0543 07/19/13 0800  BP: 109/58 110/55 112/54   Pulse: 98 87  80   Temp: 97.9 F (36.6 C) 98.1 F (36.7 C) 97.8 F (36.6 C)   TempSrc: Oral Oral Oral   Resp: 18 18 18 18   Height:      Weight:      SpO2: 96% 98% 94% 92%   General: CF, No acute distress  HEENT: NCAT, MMM  Cardiovascular: RRR with occasional bigeminy, nl S1, S2 no mrg, 2+  pulses, warm extremities  Respiratory: CTAB, no increased WOB  Abdomen: NABS, soft, NT/ND  MSK: Normal tone and bulk, no LEE, dressings right hip c/d/i, knee immobilizer in place right leg  Neuro: Grossly moves all extremities. Somewhat stiff  Psych: Not oriented to place or time, oriented to person only, smiling and pleasant   Discharge Instructions      Discharge Orders   Future Appointments Provider Department Dept Phone   10/01/2013 10:00 AM York Spaniel, MD [facility] [phone]   Future Orders Complete By Expires   Call MD for:  difficulty breathing, headache or visual disturbances  As directed    Call MD for:  extreme fatigue  As directed    Call MD for:  hives  As directed    Call MD for:  persistant dizziness or light-headedness  As directed    Call MD for:  persistant nausea and vomiting  As directed    Call MD for:  redness, tenderness, or signs of infection (pain, swelling, redness, odor or green/yellow discharge around incision site)  As directed    Call MD for:  severe uncontrolled pain  As directed    Call MD for:  temperature >100.4  As directed    Diet general  As directed    Discharge instructions  As directed    Increase activity slowly  As directed    Leave dressing on - Keep it clean, dry, and intact until clinic visit  As directed    Partial weight bearing  As directed    Scheduling Instructions:   50%   Questions:     % Body Weight:     Laterality:     Extremity:         Medication List         acetaminophen 325 MG tablet  Commonly known as:  TYLENOL  Take 2 tablets (650 mg total) by mouth every 6 (six) hours as needed for mild pain or moderate pain.     aspirin EC 81 MG tablet  Take 81 mg by mouth daily.     donepezil 10 MG tablet  Commonly known as:  ARICEPT  Take 1 tablet (10 mg total) by mouth daily.     DSS 100 MG Caps  Take 100 mg by mouth 2 (two) times daily.     enoxaparin 40 MG/0.4ML injection  Commonly  known as:  LOVENOX  Inject 0.4 mLs (40 mg total) into the skin daily.     ergocalciferol 50000 UNITS capsule  Commonly known as:  VITAMIN D2  Take 50,000 Units by mouth every 30 (thirty) days.     ferrous sulfate 325 (65 FE) MG tablet  Take 1 tablet (325 mg total) by mouth 2 (two) times daily with a meal.     HYDROcodone-acetaminophen 5-325 MG per tablet  Commonly known as:  NORCO  Take 1-2 tablets by mouth every 6 (six) hours as needed.     levothyroxine 88 MCG tablet  Commonly known as:  SYNTHROID, LEVOTHROID  Take 88 mcg by mouth daily before breakfast.  Memantine HCl ER 28 MG Cp24  Commonly known as:  NAMENDA XR  Take 28 mg by mouth daily.     polyethylene glycol packet  Commonly known as:  MIRALAX / GLYCOLAX  Take 17 g by mouth daily as needed for mild constipation.     potassium chloride SA 20 MEQ tablet  Commonly known as:  K-DUR,KLOR-CON  Take 1 tablet (20 mEq total) by mouth daily.     traMADol 50 MG tablet  Commonly known as:  ULTRAM  Take 1 tablet (50 mg total) by mouth every 6 (six) hours as needed for severe pain.     vitamin B-12 1000 MCG tablet  Commonly known as:  CYANOCOBALAMIN  Take 1,000 mcg by mouth daily.       Follow-up Information   Follow up with [facility], CHRISTOPHER ALAN. Schedule an appointment as soon as possible for a visit in 2 weeks. (For suture removal)    Specialty:  Infectious Diseases   Contact information:   [facility] [place] [address] [phone]        The results of significant diagnostics from this hospitalization (including imaging, microbiology, ancillary and laboratory) are listed below for reference.    Significant Diagnostic Studies: Dg Hip Complete Right  07/11/2013   CLINICAL DATA:  Right hip pain after fall.  EXAM: RIGHT HIP - COMPLETE 2+ VIEW  COMPARISON:  None.  FINDINGS: Moderately displaced fracture is seen involving the right proximal femoral neck. Femoral head is well situated within the  acetabulum.  IMPRESSION: Moderately displaced right proximal femoral neck fracture.   Electronically Signed   By: Roque Lias M.D.   On: 07/11/2013 20:56   Ct Angio Chest Pe W/cm &/or Wo Cm  07/12/2013   CLINICAL DATA:  Shortness of breath.  Concern for pulmonary embolus.  EXAM: CT ANGIOGRAPHY CHEST WITH CONTRAST  TECHNIQUE: Multidetector CT imaging of the chest was performed using the standard protocol during bolus administration of intravenous contrast. Multiplanar CT image reconstructions and MIPs were obtained to evaluate the vascular anatomy.  CONTRAST:  OMNIPAQUE IOHEXOL 350 MG/ML SOLN  COMPARISON:  Chest radiograph performed earlier today at 10:17 a.m.  FINDINGS: There appears to be subsegmental pulmonary embolus within pulmonary arterial branches to the left lingula and right lower lobe.  Trace bilateral pleural effusions are noted, with bibasilar atelectasis. There is no evidence of pneumothorax. No masses are identified; no abnormal focal contrast enhancement is seen.  The esophagus is diffusely dilated, with opacification of the mid to distal esophagus, and diffuse wall thickening at the distal esophagus. An underlying mass cannot be excluded. Alternatively, this may reflect prior esophageal dilatation.  Scattered calcification is seen along the aortic arch and descending thoracic aorta. Scattered coronary artery calcifications are seen. The mediastinum is otherwise unremarkable in appearance. No mediastinal lymphadenopathy is seen. No pericardial effusion is identified. No axillary lymphadenopathy is seen. The thyroid gland is not well seen.  The visualized portions of the liver and spleen are unremarkable.  No acute osseous abnormalities are seen. There is mild chronic compression deformity involving vertebral body T10.  Review of the MIP images confirms the above findings.  IMPRESSION: 1. Small subsegmental pulmonary embolus within pulmonary arterial branches to the left lingula and right  lower lobe. 2. Trace bilateral pleural effusions, with bibasilar atelectasis. 3. Diffuse esophageal dilatation, with opacification of the mid to distal esophagus, and diffuse wall thickening of the distal esophagus. An underlying mass cannot be excluded. Alternatively, this may reflect prior  esophageal dilatation. Would correlate with prior clinical history and consider endoscopy for further evaluation. 4. Scattered coronary artery calcifications seen. 5. Scattered calcification along the aortic arch and descending thoracic aorta. 6. Mild chronic compression deformity involving vertebral body T10.  Critical Value/emergent results were called by telephone at the time of interpretation on 07/12/2013 at 11:15 PM to [facility] Sakyi RN [facility], who verbally acknowledged these results.   Electronically Signed   By: Roanna RaiderJeffery  Chang M.D.   On: 07/12/2013 23:19   Ir Ivc Filter Plmt / S&i /img Guid/mod Sed  07/13/2013   INDICATION: 80 year old female with dementia and recent fall with hip fracture. Incidentally during workup to assess her injuries she was found have multiple bilateral PE. She will surely undergo hip surgery and is considered high risk for anticoagulation given her age and high risk for recurrent falls. A permanent IVC filter will therefore be placed for PE prophylaxis.  EXAM: ULTRASOUND GUIDANCE FOR VASCULARACCESS  IVC CATHETERIZATION AND VENOGRAM  IVC FILTER INSERTION  COMPARISON:  CT PE study 07/12/2013  MEDICATIONS: Fentanyl 1 mcg IV; Versed 25 mg IV  ANESTHESIA/SEDATION: Sedation Time  Twenty-five minutes  CONTRAST:  40mL OMNIPAQUE IOHEXOL 300 MG/ML  SOLN  FLUOROSCOPY TIME:  5 minutes 35 seconds  COMPLICATIONS: None immediate  PROCEDURE: Informed consent was obtained from the patient's daughter following explanation of the procedure, risks, benefits and alternatives. The patient understands, agrees and consents for the procedure. All questions were addressed. A time out was performed prior to the  initiation of the procedure.  Maximal barrier sterile technique utilized including caps, mask, sterile gowns, sterile gloves, large sterile drape, hand hygiene, and Betadine prep.  Under sterile condition and local anesthesia, right internal jugular venous access was performed with ultrasound. An ultrasound image was saved and sent to PACS. Over a guidewire, the IVC filter delivery sheath and inner dilator were advanced into the IVC just above the IVC bifurcation. Contrast injection was performed for an IVC venogram.  Through the delivery sheath, a permanent TrapEase IVC filter was deployed below the level of the renal veins and above the IVC bifurcation. Limited post deployment venacavagram was performed.  The delivery sheath was removed and hemostasis was obtained with manual compression. A dressing was placed. The patient tolerated the procedure well without immediate post procedural complication.  FINDINGS: The IVC is patent. No evidence of thrombus, stenosis, or occlusion. No variant venous anatomy. Successful placement of the IVC filter below the level of the renal veins.  IMPRESSION: Successful ultrasound and fluoroscopically guided placement of an infrarenal permanent IVC filter via right jugular approach.  Signed,  Sterling BigHeath K. McCullough, MD  [facility]  [facility]   Electronically Signed   By: Malachy MoanHeath  McCullough M.D.   On: 07/13/2013 15:26   Dg Pelvis Portable  07/17/2013   CLINICAL DATA:  Postop hip replacement.  EXAM: PORTABLE PELVIS 1-2 VIEWS  COMPARISON:  07/11/2013  FINDINGS: Imaged portions of a bipolar right hip hemiarthroplasty are unremarkable. Expected postoperative gas around the right hip. Negative for pelvic ring or left hip fracture. IVC filter.  IMPRESSION: Imaged portions of recent right hip hemiarthroplasty are unremarkable.   Electronically Signed   By: Tiburcio PeaJonathan  Watts M.D.   On: 07/17/2013 05:48   Dg Chest Port 1 View  07/12/2013    CLINICAL DATA:  Preop right leg fracture  EXAM: PORTABLE CHEST - 1 VIEW  COMPARISON:  DG CHEST 2 VIEW dated 12/21/2008; DG HIP COMPLETE*R* dated 07/11/2013  FINDINGS:  Normal cardiac silhouette with ectatic aorta. Lungs are hyperinflated with bronchitic markings. No effusion, infiltrate, or pneumothorax.  IMPRESSION: 1.  No acute cardiopulmonary process. 2. Hyperinflated lungs.   Electronically Signed   By: Genevive BiStewart  Edmunds M.D.   On: 07/12/2013 10:46    Microbiology: Recent Results (from the past 240 hour(s))  URINE CULTURE     Status: None   Collection Time    07/12/13  4:13 PM      Result Value Ref Range Status   Specimen Description URINE, CATHETERIZED   Final   Special Requests NONE   Final   Culture  Setup Time     Final   Value: 07/12/2013 17:02     Performed at [facility]   Colony Count     Final   Value: NO GROWTH     [facility]formed at [facility]   Culture     Final   Value: NO GROWTH     Performed at [facility]   Report Status 07/13/2013 FINAL   Final  CULTURE, BLOOD (ROUTINE X 2)     Status: None   Collection Time    07/12/13  4:26 PM      Result Value Ref Range Status   Specimen Description BLOOD RIGHT HAND   Final   Special Requests BOTTLES DRAWN AEROBIC ONLY 3CC   Final   Culture  Setup Time     Final   Value: 07/12/2013 20:55     Performed at [facility]   Culture     Final   Value: NO GROWTH 5 DAYS     Performed at [facility]   Report Status 07/18/2013 FINAL   Final  CULTURE, BLOOD (ROUTINE X 2)     Status: None   Collection Time    07/12/13  4:40 PM      Result Value Ref Range Status   Specimen Description BLOOD LEFT HAND   Final   Special Requests BOTTLES DRAWN AEROBIC ONLY 4CC   Final   Culture  Setup Time     Final   Value: 07/12/2013 20:55     Performed at [facility]   Culture     Final   Value: NO GROWTH 5 DAYS     Performed at [facility]   Report Status 07/18/2013 FINAL   Final  SURGICAL  PCR SCREEN     Status: None   Collection Time    07/15/13 11:16 PM      Result Value Ref Range Status   MRSA, PCR NEGATIVE  NEGATIVE Final   Staphylococcus aureus NEGATIVE  NEGATIVE Final   Comment:            The Xpert SA Assay (FDA     approved for NASAL specimens     in patients over 78 years of age),     is one component of     a comprehensive surveillance     program.  Test performance has     been validated by [facility] for patients greater     than or equal to 78 year old.     It is not intended     to diagnose infection nor to     guide or monitor treatment.     Labs: Basic Metabolic Panel:  Recent Labs Lab 07/15/13 0712 07/16/13 0400 07/17/13 0546 07/18/13 0507 07/19/13 0449  NA 144 142 142 144 141  K 3.3*  3.5* 3.8 3.3* 3.5*  CL 103 105 104 105 107  CO2 28 24 26 26 25   GLUCOSE 116* 104* 106* 121* 100*  BUN 9 10 11 12 11   CREATININE 0.64 0.62 0.57 0.75 0.68  CALCIUM 8.3* 8.5 8.6 8.5 8.1*   Liver Function Tests:  Recent Labs Lab 07/15/13 0712 07/16/13 0400 07/17/13 0546 07/18/13 0507 07/19/13 0449  AST 16 17 25 26 28   ALT 13 9 12 12 13   ALKPHOS 80 74 72 71 101  BILITOT 1.0 0.7 0.7 0.6 0.5  PROT 5.8* 5.8* 5.9* 5.3* 5.3*  ALBUMIN 2.4* 2.3* 2.5* 2.1* 1.9*   No results found for this basename: LIPASE, AMYLASE,  in the last 168 hours No results found for this basename: AMMONIA,  in the last 168 hours CBC:  Recent Labs Lab 07/15/13 0712 07/16/13 0400 07/17/13 0546 07/18/13 0507 07/19/13 0449  WBC 7.2 7.3 7.5 8.5 9.0  NEUTROABS 5.1 4.5 5.6 6.1 6.4  HGB 12.7 13.2 12.6 11.0* 11.0*  HCT 40.4 41.6 39.1 34.8* 34.7*  MCV 98.8 99.5 97.8 96.9 98.0  PLT 201 210 216 217 224   Cardiac Enzymes:  Recent Labs Lab 07/12/13 1515 07/12/13 2300 07/13/13 0500  TROPONINI <0.30 <0.30 <0.30   BNP: BNP (last 3 results) No results found for this basename: PROBNP,  in the last 8760 hours CBG: No results found for this basename: GLUCAP,  in the last  168 hours  Time coordinating discharge: 35 minutes  Signed:  Renae Fickle  [facility] 07/19/2013, 9:34 AM

## 2013-07-18 NOTE — Progress Notes (Signed)
IV in patients left wrist not removed

## 2013-07-18 NOTE — Progress Notes (Addendum)
[facility] PROGRESS NOTE  Kathy [name] UJW:[id number] DOB: Aug 08, 1922 DOA: 07/11/2013 PCP: Junious SilkALTHEIMER,MICHAEL D, MD  Brief Narrative  Pt is an 80 y.o. year old female with h/o dementia, HLD presenting with R femoral neck fracture s/p mechanical fall.Ortho was consulted on admission. She was found to be hypoxic in the Hospital on 4/30 and w/u revealed PE>>pulm was consulted and IVC filter placed on 5/1 and pt was cleared for surgery which she ha done on 5/4   Assessment/Plan:   Acute hypoxic respiratory failure secondary to bilateral subsegmental PE.  Had acute fever, SOB with hypoxia on 4/30.  STarted on heparin and subsequently had IVC filter placed at recommendation of PCCM.  Longterrm anticoagulation not recommended per pulmonary given her h/o gait disturbance and this admission s/p fall.   -  Wean oxygen as tolerated -  D/c continuous pulse oximetry  Right hip fracture, status post Mechanical fall s/p right hemiarthroplasty on 5/4 by Dr. Charlann Boxerlin - patient started on lovenox DVT proph dose per Dr. Charlann Boxerlin -  Tylenol with tramadol for breakthrough pain  -  Posterior hip precautions -  50% weight bearing RLE  Fever was likely due to PE.  Blood and urine cultures remained with no growth to date.  Completed 6-day course of ceftriaxone for possible UTI.    Dementia, stable, Continue her outpatient medication   Hypothyroidism, stable, continue Synthroid   Hypokalemia, recurrent.  Start BID potassium chloride  Acute blood loss anemia postoperative.  Continue iron supplementation  Diet:  Regular  Access:  PIV IVF:  Yes  Proph:  lovenox  Code Status: DNR Family Communication: patient and daughter Disposition Plan:  To SNF pending BP stable. Continuing to transition to room air.  Possible discharge tomorrow   Consultants:  Dr. Luna FuseM. Olin, Orthopedic surgery  Procedures: right hemiarthroplasty on 5/4 by Dr. Charlann Boxerlin  Antibiotics:  Ceftriaxone x 6 days  HPI/Subjective:  No  complaints.  Per daughter, she has been doing well.    Objective: Filed Vitals:   07/17/13 2138 07/18/13 0129 07/18/13 0700 07/18/13 1000  BP: 106/51 90/48 102/53 99/61  Pulse: 82 77 85 85  Temp: 98.3 F (36.8 C) 97.2 F (36.2 C) 98.2 F (36.8 C) 97.8 F (36.6 C)  TempSrc: Oral Oral Oral Oral  Resp: 18 19 18 18   Height:      Weight:      SpO2: 98% 95% 98% 98%    Intake/Output Summary (Last 24 hours) at 07/18/13 1122 Last data filed at 07/18/13 0815  Gross per 24 hour  Intake    480 ml  Output      0 ml  Net    480 ml   Filed Weights   07/12/13 0042  Weight: 47.356 kg (104 lb 6.4 oz)    Exam:   General:  CF, No acute distress  HEENT:  NCAT, MMM  Cardiovascular:  RRR, nl S1, S2 no mrg, 2+ pulses, warm extremities  Respiratory:  CTAB, no increased WOB  Abdomen:   NABS, soft, NT/ND  MSK:   Normal tone and bulk, no LEE, dressings right hip c/d/i, knee immobilizer in place right leg  Neuro:  Grossly moves all extremities. Somewhat stiff  Psych:  Not oriented to place or time  Data Reviewed: Basic Metabolic Panel:  Recent Labs Lab 07/14/13 0433 07/15/13 0712 07/16/13 0400 07/17/13 0546 07/18/13 0507  NA 140 144 142 142 144  K 3.6* 3.3* 3.5* 3.8 3.3*  CL 101 103 105 104 105  CO2  30 28 24 26 26   GLUCOSE 122* 116* 104* 106* 121*  BUN 10 9 10 11 12   CREATININE 0.65 0.64 0.62 0.57 0.75  CALCIUM 8.5 8.3* 8.5 8.6 8.5   Liver Function Tests:  Recent Labs Lab 07/14/13 0433 07/15/13 0712 07/16/13 0400 07/17/13 0546 07/18/13 0507  AST 15 16 17 25 26   ALT 8 13 9 12 12   ALKPHOS 93 80 74 72 71  BILITOT 1.1 1.0 0.7 0.7 0.6  PROT 6.3 5.8* 5.8* 5.9* 5.3*  ALBUMIN 2.6* 2.4* 2.3* 2.5* 2.1*   No results found for this basename: LIPASE, AMYLASE,  in the last 168 hours No results found for this basename: AMMONIA,  in the last 168 hours CBC:  Recent Labs Lab 07/14/13 0433 07/15/13 0712 07/16/13 0400 07/17/13 0546 07/18/13 0507  WBC 9.1 7.2 7.3 7.5 8.5   NEUTROABS 6.9 5.1 4.5 5.6 6.1  HGB 13.5 12.7 13.2 12.6 11.0*  HCT 42.1 40.4 41.6 39.1 34.8*  MCV 99.5 98.8 99.5 97.8 96.9  PLT 188 201 210 216 217   Cardiac Enzymes:  Recent Labs Lab 07/12/13 1515 07/12/13 2300 07/13/13 0500  TROPONINI <0.30 <0.30 <0.30   BNP (last 3 results) No results found for this basename: PROBNP,  in the last 8760 hours CBG: No results found for this basename: GLUCAP,  in the last 168 hours  Recent Results (from the past 240 hour(s))  URINE CULTURE     Status: None   Collection Time    07/12/13  4:13 PM      Result Value Ref Range Status   Specimen Description URINE, CATHETERIZED   Final   Special Requests NONE   Final   Culture  Setup Time     Final   Value: 07/12/2013 17:02     Performed at [facility]   Colony Count     Final   Value: NO GROWTH     Performed at [facility]   Culture     Final   Value: NO GROWTH     Performed at [facility]   Report Status 07/13/2013 FINAL   Final  CULTURE, BLOOD (ROUTINE X 2)     Status: None   Collection Time    07/12/13  4:26 PM      Result Value Ref Range Status   Specimen Description BLOOD RIGHT HAND   Final   Special Requests BOTTLES DRAWN AEROBIC ONLY 3CC   Final   Culture  Setup Time     Final   Value: 07/12/2013 20:55     Performed at [facility]   Culture     Final   Value: NO GROWTH 5 DAYS     Performed at [facility]   Report Status 07/18/2013 FINAL   Final  CULTURE, BLOOD (ROUTINE X 2)     Status: None   Collection Time    07/12/13  4:40 PM      Result Value Ref Range Status   Specimen Description BLOOD LEFT HAND   Final   Special Requests BOTTLES DRAWN AEROBIC ONLY 4CC   Final   Culture  Setup Time     Final   Value: 07/12/2013 20:55     Performed at [facility]   Culture     Final   Value: NO GROWTH 5 DAYS     Performed at [facility]   Report Status 07/18/2013 FINAL   Final  SURGICAL PCR SCREEN  Status: None    Collection Time    07/15/13 11:16 PM      Result Value Ref Range Status   MRSA, PCR NEGATIVE  NEGATIVE Final   Staphylococcus aureus NEGATIVE  NEGATIVE Final   Comment:            The Xpert SA Assay (FDA     approved for NASAL specimens     in patients over 57 years of age),     is one component of     a comprehensive surveillance     program.  Test performance has     been validated by [employer] for patients greater     than or equal to 2 year old.     It is not intended     to diagnose infection nor to     guide or monitor treatment.     Studies: Dg Pelvis Portable  07/17/2013   CLINICAL DATA:  Postop hip replacement.  EXAM: PORTABLE PELVIS 1-2 VIEWS  COMPARISON:  07/11/2013  FINDINGS: Imaged portions of a bipolar right hip hemiarthroplasty are unremarkable. Expected postoperative gas around the right hip. Negative for pelvic ring or left hip fracture. IVC filter.  IMPRESSION: Imaged portions of recent right hip hemiarthroplasty are unremarkable.   Electronically Signed   By: Tiburcio Pea M.D.   On: 07/17/2013 05:48    Scheduled Meds: . aspirin EC  81 mg Oral Daily  . docusate sodium  100 mg Oral BID  . donepezil  10 mg Oral Daily  . enoxaparin (LOVENOX) injection  40 mg Subcutaneous Q24H  . ferrous sulfate  325 mg Oral BID WC  . levothyroxine  88 mcg Oral QAC breakfast  . Memantine HCl ER  28 mg Oral Daily  . vitamin B-12  1,000 mcg Oral Daily  . Vitamin D (Ergocalciferol)  50,000 Units Oral Q30 days   Continuous Infusions: . sodium chloride 500 mL (07/16/13 1542)  . lactated ringers 50 mL/hr (07/16/13 1734)  . sodium chloride 0.9 % 1,000 mL with potassium chloride 10 mEq infusion 50 mL/hr at 07/18/13 4098    Active Problems:   Femur fracture, right   Femoral neck fracture   Acute pulmonary embolism   End of life care    Time spent: 30 min    Renae Fickle  [facility] Pager [phone]. If 7PM-7AM, please contact night-coverage at  [email or web address], password [facility] 07/18/2013, 11:22 AM  LOS: 7 days

## 2013-07-18 NOTE — Progress Notes (Signed)
CSW spoke with patient's daughter Joyce GrossKay via telephone [phone] who states that [facility]. CSW will send referral to facility for review and continue to follow for discharge planning to SNF.  Samuella BruinKristin Sebrena Engh, MSW, LCSWA Clinical Social Worker [facility] [phone]

## 2013-07-19 DIAGNOSIS — J9601 Acute respiratory failure with hypoxia: Secondary | ICD-10-CM

## 2013-07-19 DIAGNOSIS — E876 Hypokalemia: Secondary | ICD-10-CM

## 2013-07-19 DIAGNOSIS — D500 Iron deficiency anemia secondary to blood loss (chronic): Secondary | ICD-10-CM

## 2013-07-19 LAB — CBC WITH DIFFERENTIAL/PLATELET
BASOS ABS: 0 10*3/uL (ref 0.0–0.1)
BASOS PCT: 0 % (ref 0–1)
EOS ABS: 0.3 10*3/uL (ref 0.0–0.7)
EOS PCT: 3 % (ref 0–5)
HCT: 34.7 % — ABNORMAL LOW (ref 36.0–46.0)
Hemoglobin: 11 g/dL — ABNORMAL LOW (ref 12.0–15.0)
Lymphocytes Relative: 19 % (ref 12–46)
Lymphs Abs: 1.7 10*3/uL (ref 0.7–4.0)
MCH: 31.1 pg (ref 26.0–34.0)
MCHC: 31.7 g/dL (ref 30.0–36.0)
MCV: 98 fL (ref 78.0–100.0)
Monocytes Absolute: 0.6 10*3/uL (ref 0.1–1.0)
Monocytes Relative: 6 % (ref 3–12)
Neutro Abs: 6.4 10*3/uL (ref 1.7–7.7)
Neutrophils Relative %: 72 % (ref 43–77)
PLATELETS: 224 10*3/uL (ref 150–400)
RBC: 3.54 MIL/uL — ABNORMAL LOW (ref 3.87–5.11)
RDW: 14.7 % (ref 11.5–15.5)
WBC: 9 10*3/uL (ref 4.0–10.5)

## 2013-07-19 LAB — COMPREHENSIVE METABOLIC PANEL
ALBUMIN: 1.9 g/dL — AB (ref 3.5–5.2)
ALK PHOS: 101 U/L (ref 39–117)
ALT: 13 U/L (ref 0–35)
AST: 28 U/L (ref 0–37)
BILIRUBIN TOTAL: 0.5 mg/dL (ref 0.3–1.2)
BUN: 11 mg/dL (ref 6–23)
CHLORIDE: 107 meq/L (ref 96–112)
CO2: 25 meq/L (ref 19–32)
Calcium: 8.1 mg/dL — ABNORMAL LOW (ref 8.4–10.5)
Creatinine, Ser: 0.68 mg/dL (ref 0.50–1.10)
GFR calc Af Amer: 88 mL/min — ABNORMAL LOW (ref >90)
GFR calc non Af Amer: 76 mL/min — ABNORMAL LOW (ref >90)
Glucose, Bld: 100 mg/dL — ABNORMAL HIGH (ref 70–99)
Potassium: 3.5 mEq/L — ABNORMAL LOW (ref 3.7–5.3)
Sodium: 141 mEq/L (ref 137–147)
Total Protein: 5.3 g/dL — ABNORMAL LOW (ref 6.0–8.3)

## 2013-07-19 MED ORDER — POLYETHYLENE GLYCOL 3350 17 G PO PACK: 17.0000 g | PACK | Freq: Every day | ORAL | Status: AC | PRN

## 2013-07-19 MED ORDER — POTASSIUM CHLORIDE CRYS ER 20 MEQ PO TBCR: 20.0000 meq | EXTENDED_RELEASE_TABLET | Freq: Every day | ORAL | Status: DC

## 2013-07-19 MED ORDER — DSS 100 MG PO CAPS: 100.0000 mg | ORAL_CAPSULE | Freq: Two times a day (BID) | ORAL | Status: DC

## 2013-07-19 MED ORDER — FERROUS SULFATE 325 (65 FE) MG PO TABS: 325.0000 mg | ORAL_TABLET | Freq: Two times a day (BID) | ORAL | Status: AC

## 2013-07-19 NOTE — Progress Notes (Signed)
Pt Alert and verbally responsive. Pt daughter at bedside. Discharge education and instructions completed. Report called off to [facility] KidneyDebra at [facility]. [facility] called for pt transportation and awaiting on [facility] to pick pt up to disposition. All lines including IV removed from pt. Will continue to monitor pt quietly till picked up. P. Amo Thedore Pickel RN.

## 2013-07-19 NOTE — Progress Notes (Signed)
[facility] contacted to transport patient to [facility].  Samuella BruinKristin Neira Bentsen, MSW, [facility] Clinical Social Worker [phone]

## 2013-07-19 NOTE — Progress Notes (Signed)
Physical Therapy Treatment Patient Details Name: Kathy CraftsLouise B Mckeehan MRN: [id number] DOB: 09/26/1922 Today's Date: 07/19/2013    History of Present Illness Pt admitted after fall 4/29 sustaining proximal femur fx, s/p R hip hemiarthroplasty.  CT also showed Bil PE, pt s/p IVC filter placement     PT Comments    Expect will be slow to progress given pt's resistance to exercise and mobilize..  Follow Up Recommendations  SNF     Equipment Recommendations  None recommended by PT    Recommendations for Other Services       Precautions / Restrictions Precautions Precautions: Posterior Hip;Fall Required Braces or Orthoses: Knee Immobilizer - Right Restrictions RLE Weight Bearing: Partial weight bearing RLE Partial Weight Bearing Percentage or Pounds: 50    Mobility  Bed Mobility Overal bed mobility: Needs Assistance Bed Mobility: Supine to Sit;Sit to Supine     Supine to sit: Max assist Sit to supine: Total assist;+2 for physical assistance   General bed mobility comments: v/tc's for  guidance through tasks.  significant truncal assist  Transfers Overall transfer level: Needs assistance Equipment used: Rolling walker (2 wheeled) Transfers: Sit to/from Stand Sit to Stand: Max assist;+2 physical assistance         General transfer comment: Pt was resistant to stand and cried out as if in pain though more likely fear.  Ambulation/Gait                 Stairs            Wheelchair Mobility    Modified Rankin (Stroke Patients Only)       Balance Overall balance assessment: Needs assistance Sitting-balance support: Feet supported;Single extremity supported Sitting balance-Leahy Scale: Poor Sitting balance - Comments: worked on sitting balance and tolerance.  Did  LAQ bil AAROM x7 reps for strengthening and balance challenge.     Standing balance-Leahy Scale: Zero                      Cognition Arousal/Alertness: Lethargic Behavior During  Therapy: Restless Overall Cognitive Status: History of cognitive impairments - at baseline                      Exercises      General Comments        Pertinent Vitals/Pain     Home Living                      Prior Function            PT Goals (current goals can now be found in the care plan section) Acute Rehab PT Goals Patient Stated Goal: pt unable to participate PT Goal Formulation: With patient/family Time For Goal Achievement: 07/24/13 Potential to Achieve Goals: Fair Progress towards PT goals: Not progressing toward goals - comment (Resistant to mobility and exercise)    Frequency  Min 3X/week    PT Plan Current plan remains appropriate    Co-evaluation             End of Session   Activity Tolerance: Other (comment) (mild pain and decrease cognition limited session) Patient left: in bed;with call bell/phone within reach     Time: 1050-1113 PT Time Calculation (min): 23 min  Charges:  $Therapeutic Activity: 23-37 mins                    G Codes:      Eliseo GumKenneth V Adithi Gammon  07/19/2013, 3:47 PM 07/19/2013  Gramling BingKen Najeh Credit, PT [phone]  (pager)

## 2013-07-19 NOTE — Progress Notes (Signed)
CSW spoke with patient's daughter this morning who completed admissions paperwork [facility]. CSW confirmed with facility that patient has bed available. Facility waiting on insurance authorization. CSW is continuing to follow for discharge planning to SNF.  Samuella BruinKristin Indy Kuck, MSW, [facility] LPCSWA Clinical Social Worker [phone]

## 2013-08-14 ENCOUNTER — Emergency Department (HOSPITAL_COMMUNITY)
Admission: EM | Admit: 2013-08-14 | Discharge: 2013-08-14 | Disposition: A | Discharge status: Home / Self Care | Payer: Medicare HMO | Attending: Emergency Medicine | Admitting: Emergency Medicine

## 2013-08-14 ENCOUNTER — Emergency Department (HOSPITAL_COMMUNITY): Payer: Medicare HMO

## 2013-08-14 ENCOUNTER — Encounter (HOSPITAL_COMMUNITY): Payer: Self-pay | Admitting: Emergency Medicine

## 2013-08-14 DIAGNOSIS — E559 Vitamin D deficiency, unspecified: Secondary | ICD-10-CM | POA: Insufficient documentation

## 2013-08-14 DIAGNOSIS — M79609 Pain in unspecified limb: Secondary | ICD-10-CM | POA: Insufficient documentation

## 2013-08-14 DIAGNOSIS — Z7982 Long term (current) use of aspirin: Secondary | ICD-10-CM | POA: Insufficient documentation

## 2013-08-14 DIAGNOSIS — R6884 Jaw pain: Secondary | ICD-10-CM

## 2013-08-14 DIAGNOSIS — F0390 Unspecified dementia without behavioral disturbance: Secondary | ICD-10-CM | POA: Insufficient documentation

## 2013-08-14 DIAGNOSIS — F0280 Dementia in other diseases classified elsewhere without behavioral disturbance: Secondary | ICD-10-CM | POA: Insufficient documentation

## 2013-08-14 DIAGNOSIS — Z8781 Personal history of (healed) traumatic fracture: Secondary | ICD-10-CM | POA: Insufficient documentation

## 2013-08-14 DIAGNOSIS — Z79899 Other long term (current) drug therapy: Secondary | ICD-10-CM | POA: Insufficient documentation

## 2013-08-14 DIAGNOSIS — G309 Alzheimer's disease, unspecified: Secondary | ICD-10-CM | POA: Insufficient documentation

## 2013-08-14 LAB — BASIC METABOLIC PANEL
BUN: 9 mg/dL (ref 6–23)
CHLORIDE: 100 meq/L (ref 96–112)
CO2: 28 mEq/L (ref 19–32)
Calcium: 9.3 mg/dL (ref 8.4–10.5)
Creatinine, Ser: 0.84 mg/dL (ref 0.50–1.10)
GFR, EST AFRICAN AMERICAN: 70 mL/min — AB (ref >90)
GFR, EST NON AFRICAN AMERICAN: 60 mL/min — AB (ref >90)
GLUCOSE: 95 mg/dL (ref 70–99)
POTASSIUM: 3.9 meq/L (ref 3.7–5.3)
SODIUM: 139 meq/L (ref 137–147)

## 2013-08-14 LAB — URINALYSIS, ROUTINE W REFLEX MICROSCOPIC
Bilirubin Urine: NEGATIVE
Glucose, UA: NEGATIVE mg/dL
Hgb urine dipstick: NEGATIVE
Ketones, ur: NEGATIVE mg/dL
LEUKOCYTES UA: NEGATIVE
Nitrite: NEGATIVE
Protein, ur: NEGATIVE mg/dL
SPECIFIC GRAVITY, URINE: 1.009 (ref 1.005–1.030)
UROBILINOGEN UA: 0.2 mg/dL (ref 0.0–1.0)
pH: 6 (ref 5.0–8.0)

## 2013-08-14 LAB — CBC WITH DIFFERENTIAL/PLATELET
Basophils Absolute: 0 10*3/uL (ref 0.0–0.1)
Basophils Relative: 0 % (ref 0–1)
EOS ABS: 0.3 10*3/uL (ref 0.0–0.7)
Eosinophils Relative: 4 % (ref 0–5)
HCT: 38.6 % (ref 36.0–46.0)
HEMOGLOBIN: 12.4 g/dL (ref 12.0–15.0)
Lymphocytes Relative: 30 % (ref 12–46)
Lymphs Abs: 2.2 10*3/uL (ref 0.7–4.0)
MCH: 31.6 pg (ref 26.0–34.0)
MCHC: 32.1 g/dL (ref 30.0–36.0)
MCV: 98.5 fL (ref 78.0–100.0)
Monocytes Absolute: 0.5 10*3/uL (ref 0.1–1.0)
Monocytes Relative: 7 % (ref 3–12)
NEUTROS PCT: 59 % (ref 43–77)
Neutro Abs: 4.2 10*3/uL (ref 1.7–7.7)
PLATELETS: 260 10*3/uL (ref 150–400)
RBC: 3.92 MIL/uL (ref 3.87–5.11)
RDW: 15.3 % (ref 11.5–15.5)
WBC: 7.2 10*3/uL (ref 4.0–10.5)

## 2013-08-14 LAB — TROPONIN I: Troponin I: <0.3 ng/mL (ref <0.30)

## 2013-08-14 NOTE — ED Notes (Signed)
Per EMS pt began complaining of some left jaw pain radiating down the left arm according to the family. Pt denies any cp at this time.

## 2013-08-14 NOTE — ED Provider Notes (Signed)
CSN: [id number]     Arrival date & time 08/14/13  2013 History   First MD Initiated Contact with Patient 08/14/13 2014     Chief Complaint  Patient presents with  . Jaw Pain  . Arm Pain    lt      (Consider location/radiation/quality/duration/timing/severity/associated sxs/prior Treatment) HPI Comments: Patient brought to the ER via EMS. Patient reportedly complained of left-sided jaw pain and left arm pain earlier tonight. Timing is unclear, upon arrival to the ER, she is without complaints. She is difficult to evaluate and get a history from, however, because of dementia. Level V Caveat due to  Dementia.  Patient is a 80 y.o. female presenting with arm pain.  Arm Pain    Past Medical History  Diagnosis Date  . Dementia   . Dyslipidemia   . Vitamin D deficiency   . Alzheimer's disease 06/16/2012  . Ankle fracture, left     As a child  . Hiatal hernia    Past Surgical History  Procedure Laterality Date  . Ankle fracture surgery Left   . Hiatal hernia repair N/A   . Cataract extraction Left   . Hip arthroplasty Right 07/16/2013    Procedure: RIGHT HEMIARTHROPLASTY HIP;  Surgeon: Shelda Pal, MD;  Location: [facility] OR;  Service: Orthopedics;  Laterality: Right;   Family History  Problem Relation Age of Onset  . Heart disease Mother   . Cancer Father   . Heart disease Sister    History  Substance Use Topics  . Smoking status: Never Smoker   . Smokeless tobacco: Not on file  . Alcohol Use: No   OB History   Grav Para Term Preterm Abortions TAB SAB Ect Mult Living                 Review of Systems  Unable to perform ROS: Dementia      Allergies  Codeine  Home Medications   Prior to Admission medications   Medication Sig Start Date End Date Taking? Authorizing Provider  acetaminophen (TYLENOL) 325 MG tablet Take 2 tablets (650 mg total) by mouth every 6 (six) hours as needed for mild pain or moderate pain. 07/17/13   Shelda Pal, MD  aspirin EC 81 MG tablet  Take 81 mg by mouth daily.    Historical Provider, MD  docusate sodium 100 MG CAPS Take 100 mg by mouth 2 (two) times daily. 07/19/13   Renae Fickle, MD  donepezil (ARICEPT) 10 MG tablet Take 1 tablet (10 mg total) by mouth daily. 03/09/13   York Spaniel, MD  enoxaparin (LOVENOX) 40 MG/0.4ML injection Inject 0.4 mLs (40 mg total) into the skin daily. 07/16/13   Shelda Pal, MD  ergocalciferol (VITAMIN D2) 50000 UNITS capsule Take 50,000 Units by mouth every 30 (thirty) days.     Historical Provider, MD  ferrous sulfate 325 (65 FE) MG tablet Take 1 tablet (325 mg total) by mouth 2 (two) times daily with a meal. 07/19/13   Renae Fickle, MD  HYDROcodone-acetaminophen (NORCO) 5-325 MG per tablet Take 1-2 tablets by mouth every 6 (six) hours as needed. 07/16/13   Shelda Pal, MD  levothyroxine (SYNTHROID, LEVOTHROID) 88 MCG tablet Take 88 mcg by mouth daily before breakfast.    Historical Provider, MD  Memantine HCl ER (NAMENDA XR) 28 MG CP24 Take 28 mg by mouth daily. 02/01/13   York Spaniel, MD  polyethylene glycol [facility] Ethelene Hal) packet Take 17 g by mouth daily  as needed for mild constipation. 07/19/13   Renae FickleMackenzie Short, MD  potassium chloride (K-DUR,KLOR-CON) 20 MEQ tablet Take 1 tablet (20 mEq total) by mouth daily. 07/19/13   Renae FickleMackenzie Short, MD  traMADol (ULTRAM) 50 MG tablet Take 1 tablet (50 mg total) by mouth every 6 (six) hours as needed for severe pain. 07/17/13   Shelda PalMatthew D Olin, MD  vitamin B-12 (CYANOCOBALAMIN) 1000 MCG tablet Take 1,000 mcg by mouth daily.    Historical Provider, MD   There were no vitals taken for this visit. Physical Exam  Constitutional: She is oriented to person, place, and time. She appears well-developed and well-nourished. No distress.  HENT:  Head: Normocephalic and atraumatic.  Right Ear: Hearing normal.  Left Ear: Hearing normal.  Nose: Nose normal.  Mouth/Throat: Oropharynx is clear and moist and mucous membranes are normal.  Eyes: Conjunctivae  and EOM are normal. Pupils are equal, round, and reactive to light.  Neck: Normal range of motion. Neck supple.  Cardiovascular: Regular rhythm, S1 normal and S2 normal.  Exam reveals no gallop and no friction rub.   No murmur heard. Pulmonary/Chest: Effort normal and breath sounds normal. No respiratory distress. She exhibits no tenderness.  Abdominal: Soft. Normal appearance and bowel sounds are normal. There is no hepatosplenomegaly. There is no tenderness. There is no rebound, no guarding, no tenderness at McBurney's point and negative Murphy's sign. No hernia.  Musculoskeletal: Normal range of motion.  Neurological: She is alert and oriented to person, place, and time. She has normal strength. No cranial nerve deficit or sensory deficit. Coordination normal. GCS eye subscore is 4. GCS verbal subscore is 5. GCS motor subscore is 6.  Skin: Skin is warm, dry and intact. No rash noted. No cyanosis.  Psychiatric: She has a normal mood and affect. Her speech is normal and behavior is normal. Thought content normal.    ED Course  Procedures (including critical care time) Labs Review Labs Reviewed  CBC WITH DIFFERENTIAL  BASIC METABOLIC PANEL  TROPONIN I    Imaging Review No results found.   EKG Interpretation None      MDM   Final diagnoses:  None   jaw pain, possible chest pain  Family now in the room and gives additional information. Patient has been fatigued all day, could not participate in her physical therapy because of the fatigue. Then this evening while they were with her, she started complaining of jaw pain. It lasted for approximately 15 minutes and then resolved. Later she started complaining of pain again and stated that it was going into the left shoulder. This was when she was transferred to the ER. Upon arrival, however, she denies this pain.  She has not had any recurrent pain while she is here in the ER. She has been monitored for several hours. Her troponin is  negative. EKG was unchanged from previous. Pain lasted only a few minutes and was difficult to quantify her know if it was even present because of her dementia. I do not feel the patient requires admission at this time. She can be returned to the nursing home, return if there is any recurrent pain.  This did have some fatigue earlier in the day today. Lab work was normal. Urinalysis is normal. No further workup necessary.   Gilda Creasehristopher J. Pollina, MD 08/14/13 [phone]

## 2013-08-14 NOTE — ED Notes (Signed)
Discharge instructions reviewed with family. Family to take pt back to [facility] care center. Report called Wilford Grist.

## 2013-08-14 NOTE — Discharge Instructions (Signed)
Chest Pain (Nonspecific) °It is often hard to give a specific diagnosis for the cause of chest pain. There is always a chance that your pain could be related to something serious, such as a heart attack or a blood clot in the lungs. You need to follow up with your caregiver for further evaluation. °CAUSES  °· Heartburn. °· Pneumonia or bronchitis. °· Anxiety or stress. °· Inflammation around your heart (pericarditis) or lung (pleuritis or pleurisy). °· A blood clot in the lung. °· A collapsed lung (pneumothorax). It can develop suddenly on its own (spontaneous pneumothorax) or from injury (trauma) to the chest. °· Shingles infection (herpes zoster virus). °The chest wall is composed of bones, muscles, and cartilage. Any of these can be the source of the pain. °· The bones can be bruised by injury. °· The muscles or cartilage can be strained by coughing or overwork. °· The cartilage can be affected by inflammation and become sore (costochondritis). °DIAGNOSIS  °Lab tests or other studies, such as X-rays, electrocardiography, stress testing, or cardiac imaging, may be needed to find the cause of your pain.  °TREATMENT  °· Treatment depends on what may be causing your chest pain. Treatment may include: °· Acid blockers for heartburn. °· Anti-inflammatory medicine. °· Pain medicine for inflammatory conditions. °· Antibiotics if an infection is present. °· You may be advised to change lifestyle habits. This includes stopping smoking and avoiding alcohol, caffeine, and chocolate. °· You may be advised to keep your head raised (elevated) when sleeping. This reduces the chance of acid going backward from your stomach into your esophagus. °· Most of the time, nonspecific chest pain will improve within 2 to 3 days with rest and mild pain medicine. °HOME CARE INSTRUCTIONS  °· If antibiotics were prescribed, take your antibiotics as directed. Finish them even if you start to feel better. °· For the next few days, avoid physical  activities that bring on chest pain. Continue physical activities as directed. °· Do not smoke. °· Avoid drinking alcohol. °· Only take over-the-counter or prescription medicine for pain, discomfort, or fever as directed by your caregiver. °· Follow your caregiver's suggestions for further testing if your chest pain does not go away. °· Keep any follow-up appointments you made. If you do not go to an appointment, you could develop lasting (chronic) problems with pain. If there is any problem keeping an appointment, you must call to reschedule. °SEEK MEDICAL CARE IF:  °· You think you are having problems from the medicine you are taking. Read your medicine instructions carefully. °· Your chest pain does not go away, even after treatment. °· You develop a rash with blisters on your chest. °SEEK IMMEDIATE MEDICAL CARE IF:  °· You have increased chest pain or pain that spreads to your arm, neck, jaw, back, or abdomen. °· You develop shortness of breath, an increasing cough, or you are coughing up blood. °· You have severe back or abdominal pain, feel nauseous, or vomit. °· You develop severe weakness, fainting, or chills. °· You have a fever. °THIS IS AN EMERGENCY. Do not wait to see if the pain will go away. Get medical help at once. Call your local emergency services (911 in [place] Do not drive yourself to the hospital. °MAKE SURE YOU:  °· Understand these instructions. °· Will watch your condition. °· Will get help right away if you are not doing well or get worse. °Document Released: 12/09/2004 Document Revised: 05/24/2011 Document Reviewed: 10/05/2007 °Exit[employer]® Patient Information ©2014 [employer]   [employer]. ° °

## 2013-08-14 NOTE — ED Notes (Signed)
Per family pt has been more tired than normal, wasn't able to complete much of her therapy. Pt's family states the pt kept asking "why do I have this pain?" per family pt was pointing to her left jaw and said it moved down her left arm. Dr. Blinda Leatherwood at bedside.

## 2013-08-14 NOTE — ED Notes (Signed)
Pt denies any symptoms at this time

## 2013-10-01 ENCOUNTER — Ambulatory Visit (INDEPENDENT_AMBULATORY_CARE_PROVIDER_SITE_OTHER): Payer: Medicare HMO | Admitting: Neurology

## 2013-10-01 ENCOUNTER — Encounter (INDEPENDENT_AMBULATORY_CARE_PROVIDER_SITE_OTHER): Payer: Self-pay

## 2013-10-01 ENCOUNTER — Encounter: Payer: Self-pay | Admitting: Neurology

## 2013-10-01 VITALS — BP 99/49 | HR 70

## 2013-10-01 DIAGNOSIS — G309 Alzheimer's disease, unspecified: Principal | ICD-10-CM

## 2013-10-01 DIAGNOSIS — F0280 Dementia in other diseases classified elsewhere without behavioral disturbance: Secondary | ICD-10-CM

## 2013-10-01 MED ORDER — DONEPEZIL HCL 5 MG PO TABS: 5.0000 mg | ORAL_TABLET | Freq: Every day | ORAL | Status: DC

## 2013-10-01 NOTE — Patient Instructions (Signed)
Alzheimer Disease Alzheimer's Disease is a mental disorder. It causes memory loss and loss of other mental functions, such as learning, thinking, solving problems, communicating, and completing tasks. The mental losses interfere with the ability to perform daily activities at work, at home, or in social situations. Alzheimer's Disease usually starts in a person's late 60s or early 70s but can start earlier in life (familial form). The mental changes caused by this disease are permanent and worsen over time. As the illness progresses, the ability to do even the simplest things is lost. Survival with Alzheimer's Disease ranges from several years to as long as 20 years. CAUSES Alzheimer's Disease is caused by abnormally high levels of a protein (beta-amyloid) in the brain. This protein forms very small deposits within and around the brain's nerve cells. These deposits prevent the nerve cells from working properly. Experts are not certain what causes the beta-amyloid deposits in this disease. RISK FACTORS The following major risk factors have been identified:  Increasing age.  Certain genetic variations, such as Down syndrome (trisomy 21). SYMPTOMS In the early stages of Alzheimer's Disease, you are still able to perform daily activities but need greater effort, more time, or memory aids. Early symptoms include:  Mild memory loss of recent events, names, or phone numbers.  Loss of objects.  Minor loss of vocabulary.  Difficulty with complex tasks, such as paying bills or driving in unfamiliar locations. Other mental functions deteriorate as the disease worsens. These changes slowly go from mild to severe. Symptoms at this stage include:  Difficulty remembering. You may not be able to recall personal information such as your address and telephone number. You may become confused about the date, the season of the year, or your location.  Difficulty maintaining attention. You may forget what you  wanted to say during conversations and repeat what you have already said.  Difficulty learning new information or tasks. You may not remember what you read or the name of a new friend you met.  Difficulty counting or doing math. You may have difficulty with complex math problems. You may make mistakes in paying bills or managing your checkbook.  Poor reasoning and judgment. You may make poor decisions or not dress right for the weather.  Difficulty communicating. You may have regular difficulty remembering words, naming objects, expressing yourself clearly, or writing sentences that make sense.  Difficulty performing familiar daily activities. You may get lost driving in familiar locations or need help eating, bathing, dressing, grooming, or using the toilet. You may have difficulty maintaining bladder or bowel control.  Difficulty recognizing familiar faces. You may confuse family members or close friends with one another. You may not recognize a close relative or may mistake strangers for family. Alzheimer's Disease also may cause changes in personality and behavior. These changes include:   Loss of interest or motivation.  Social withdrawal.  Anxiety.  Difficulty sleeping.  Uncharacteristic anger or combativeness.  A false belief that someone is trying to harm you (paranoia).  Seeing things that are not real (hallucinations).  Agitation. Confusion and disruptive behavior are often worse at night and may be triggered by changes in the environment or acute medical issues. DIAGNOSIS  Alzheimer's Disease is diagnosed through an assessment by your health care provider. During this assessment, your health care provider will do the following:  Ask you and your family, friends, or caregiver questions about your symptoms, their frequency, their duration and progression, and the effect they are having on your life.    Ask questions about your personal and family medical history and use of  alcohol or drugs, including prescription medicine.  Perform a physical exam and order blood tests and brain imaging exams. Your health care provider may refer you to a specialist for detailed evaluation of your mental functions (neuropsychological testing).  Many different brain disorders, medical conditions, and certain substances can cause symptoms that resemble Alzheimer's Disease symptoms. These must be ruled out before this disease can be diagnosed. If Alzheimer's Disease is diagnosed, it will be considered either "possible" or "probable" Alzheimer's Disease. "Possible" Alzheimer's Disease means that your symptoms are typical of the disease and no other disorder is causing them. "Probable" Alzheimer's Disease means that you also have a family history of the disease or genetic test results that support the diagnosis. Certain tests, mostly used in research studies, are highly specific for Alzheimer's Disease.  TREATMENT  There is currently no cure for this disease. The goals of treatment are to:  Slow down the progression of the disease.  Preserve mental function as long as possible.  Manage behavioral symptoms.  Make life easier for the person with Alzheimer's Disease and his or her caregivers. The following treatment options are available:  Medicine. Certain medicines may help slow memory loss by changing the level of certain chemicals in the brain. Medicine may also help with behavioral symptoms.  Talk therapy. Talk therapy provides education, support, and memory aids for people with this disease. It is most effective in the early stages of the illness.  Caregiving. Caregivers may be family members, friends, or trained medical professionals. They help the person with Alzheimer's Disease with daily life activities. Caregiving may take place at home or at a nursing facility.  Family support groups. These provide education, emotional support, and information about community resources to  family members who are taking care of the person with this disease. Document Released: 11/11/2003 Document Revised: 03/06/2013 Document Reviewed: 07/07/2012 Exit[employer] Patient Information 2015 [employer]. This information is not intended to replace advice given to you by your health care provider. Make sure you discuss any questions you have with your health care provider.  

## 2013-10-01 NOTE — Progress Notes (Signed)
Reason for visit: Alzheimer's disease  Kathy [name] is an 80 y.o. female  History of present illness:  Kathy [name] is an 80 year old left-handed white female with a history of a progressive memory disturbance secondary to Alzheimer's disease. In April of 2015, the patient fell and fractured her right femur which required surgery and rehabilitation following this. During that hospitalization, she suffered bilateral pulmonary emboli, and an IVC filter was placed. She was on Coumadin transiently, but she is currently not on this medication. She has a significant gait disorder, with a tendency to lean backwards. She is still losing weight, but the family indicates that she is eating well. She remains on Aricept taking 10 mg at night. She is not having any issues with sleeping at night or with agitation during the day. She returns to this office for an evaluation.  Past Medical History  Diagnosis Date  . Dementia   . Dyslipidemia   . Vitamin D deficiency   . Alzheimer's disease 06/16/2012  . Ankle fracture, left     As a child  . Hiatal hernia   . History of pulmonary embolism     IVC filter placed  . Abnormality of gait     Past Surgical History  Procedure Laterality Date  . Ankle fracture surgery Left   . Hiatal hernia repair N/A   . Cataract extraction Left   . Hip arthroplasty Right 07/16/2013    Procedure: RIGHT HEMIARTHROPLASTY HIP;  Surgeon: Shelda Pal, MD;  Location: [facility] OR;  Service: Orthopedics;  Laterality: Right;    Family History  Problem Relation Age of Onset  . Heart disease Mother   . Cancer Father   . Heart disease Sister     Social history:  reports that she has never smoked. She has never used smokeless tobacco. She reports that she does not drink alcohol or use illicit drugs.    Allergies  Allergen Reactions  . Codeine Other (See Comments)    Unknown     Medications:  Current Outpatient Prescriptions on File Prior to Visit  Medication Sig  Dispense Refill  . acetaminophen (TYLENOL) 325 MG tablet Take 2 tablets (650 mg total) by mouth every 6 (six) hours as needed for mild pain or moderate pain.  90 tablet  1  . aspirin EC 81 MG tablet Take 81 mg by mouth daily.      . ferrous sulfate 325 (65 FE) MG tablet Take 1 tablet (325 mg total) by mouth 2 (two) times daily with a meal.  60 tablet  0  . Memantine HCl ER (NAMENDA XR) 28 MG CP24 Take 28 mg by mouth daily.  90 capsule  3  . polyethylene glycol (MIRALAX / GLYCOLAX) packet Take 17 g by mouth daily as needed for mild constipation.  14 each  0  . vitamin B-12 (CYANOCOBALAMIN) 1000 MCG tablet Take 1,000 mcg by mouth daily.      . ergocalciferol (VITAMIN D2) 50000 UNITS capsule Take 50,000 Units by mouth every 30 (thirty) days.        No current facility-administered medications on file prior to visit.    ROS:  Out of a complete 14 system review of symptoms, the patient complains only of the following symptoms, and all other reviewed systems are negative.  Runny nose Walking difficulty Memory loss  Blood pressure 99/49, pulse 70, weight 0 lb (0 kg).  Physical Exam  General: The patient is alert and cooperative at the time of the  examination.  Skin: No significant peripheral edema is noted.   Neurologic Exam  Mental status: The Mini-Mental status examination done today shows a total score of 8/30.  Cranial nerves: Facial symmetry is present. Speech is normal, no aphasia or dysarthria is noted. Extraocular movements are full. Visual fields are full.  Motor: The patient has good strength in all 4 extremities.  Sensory examination: Soft touch sensation is symmetric on the face, arms, and legs.  Coordination: The patient has good finger-nose-finger and heel-to-shin bilaterally. The patient has significant apraxia with the use of the extremities.  Gait and station: The patient requires assistance with standing. Once up, and she has a tendency to lean backwards. She is  unable to ambulate independently.  Reflexes: Deep tendon reflexes are symmetric.   Assessment/Plan:  1. Alzheimer's disease  2. Gait disorder  The patient is to continue to lose weight, she has lost 10 pounds since she was seen in November 2014. The Aricept will be reduced taking a 5 milligram tablet at night. The family indicates that she is eating well. She will followup in 6-8 months.  Marlan Palau. Keith Willis MD 10/01/2013 7:08 PM  [facility] [address] [place], [place] [id number]  Phone [phone] Fax [phone]

## 2013-12-28 ENCOUNTER — Other Ambulatory Visit: Payer: Self-pay

## 2014-02-13 ENCOUNTER — Other Ambulatory Visit: Payer: Self-pay | Admitting: Neurology

## 2014-03-28 ENCOUNTER — Encounter (HOSPITAL_COMMUNITY): Payer: Self-pay | Admitting: Orthopedic Surgery

## 2014-04-03 ENCOUNTER — Encounter: Payer: Self-pay | Admitting: Adult Health

## 2014-04-03 ENCOUNTER — Ambulatory Visit (INDEPENDENT_AMBULATORY_CARE_PROVIDER_SITE_OTHER): Payer: Medicare HMO | Admitting: Adult Health

## 2014-04-03 VITALS — BP 95/62 | HR 75 | Ht 61.0 in | Wt 98.0 lb

## 2014-04-03 DIAGNOSIS — F0280 Dementia in other diseases classified elsewhere without behavioral disturbance: Secondary | ICD-10-CM

## 2014-04-03 DIAGNOSIS — R269 Unspecified abnormalities of gait and mobility: Secondary | ICD-10-CM

## 2014-04-03 DIAGNOSIS — G309 Alzheimer's disease, unspecified: Secondary | ICD-10-CM

## 2014-04-03 MED ORDER — TRAZODONE HCL 50 MG PO TABS: 25.0000 mg | ORAL_TABLET | Freq: Every day | ORAL | Status: AC

## 2014-04-03 NOTE — Progress Notes (Signed)
I have read the note, and I agree with the clinical assessment and plan.  Ladarien Beeks KEITH   

## 2014-04-03 NOTE — Progress Notes (Signed)
PATIENT: Kathy [name] DOB: June 22, 1922  REASON FOR VISIT: follow up-Alzheimer's disease, abnormality of gait HISTORY FROM: patient  HISTORY OF PRESENT ILLNESS:  Kathy [name] is an 81 year old female with a history of Alzheimer's disease. She returns today for follow-up. The patient is currently taken Aricept 5 mg daily. She was unable to tolerate the higher doses due to weight loss. The patient currently lives-. She requires assistance with her ADLs. Her last MMSE was 8/30. Daughter states that the last six months things have declined. She has always had some trouble with ambulation but as since as lost the ability to ambulate. Before she was using a walker with assistance. She has been in a wheelchair for the last 4-5 months according to the daughter. She can't bear weight on grab bars to help assist with transfers to the bathroom.Patient used to sleep 16 hours at night but now she is having trouble staying asleep at night. Daughter states that she becomes restless and then will become agitated. The daughter denies any aggression. The daughter has tried nonpharmacologic treatments for sleep. She tries to keep the patient busy throughout the day, keeps the blinds open and tries to limit naps throughout the day. So far this has not been beneficial. The daughter does feel that a hospital bed will be beneficial for the patient. She feels that it'll help with movement in the bed and the rails will provide safety to keep the patient from falling out of the bed. Family alternates taking care of the patient but the daughter primarily does everything. They alternate staying at the daughters home and the patient's home. The daughter's home is the patient's old home. Patient continues to eat well. She does have to help feed her. Otherwise the daughter states that the patient is very pleasant to take care of.  HISTORY 10/01/13 (WILLIS): Kathy [name] is an 81 year old left-handed white female with a history of a  progressive memory disturbance secondary to Alzheimer's disease. In April of 2015, the patient fell and fractured her right femur which required surgery and rehabilitation following this. During that hospitalization, she suffered bilateral pulmonary emboli, and an IVC filter was placed. She was on Coumadin transiently, but she is currently not on this medication. She has a significant gait disorder, with a tendency to lean backwards. She is still losing weight, but the family indicates that she is eating well. She remains on Aricept taking 10 mg at night. She is not having any issues with sleeping at night or with agitation during the day. She returns to this office for an evaluation.  REVIEW OF SYSTEMS: Out of a complete 14 system review of symptoms, the patient complains only of the following symptoms, and all other reviewed systems are negative.  Running, constipation, frequent waking, sleep talking, memory loss, behavior problem, confusion  ALLERGIES: Allergies  Allergen Reactions  . Codeine Other (See Comments)    Unknown     HOME MEDICATIONS: Outpatient Prescriptions Prior to Visit  Medication Sig Dispense Refill  . acetaminophen (TYLENOL) 325 MG tablet Take 2 tablets (650 mg total) by mouth every 6 (six) hours as needed for mild pain or moderate pain. 90 tablet 1  . aspirin EC 81 MG tablet Take 81 mg by mouth daily.    Marland Kitchen. donepezil (ARICEPT) 5 MG tablet Take 1 tablet (5 mg total) by mouth at bedtime. 90 tablet 1  . ergocalciferol (VITAMIN D2) 50000 UNITS capsule Take 50,000 Units by mouth every 30 (thirty) days.     .Marland Kitchen  ferrous sulfate 325 (65 FE) MG tablet Take 1 tablet (325 mg total) by mouth 2 (two) times daily with a meal. 60 tablet 0  . levothyroxine (SYNTHROID, LEVOTHROID) 75 MCG tablet Take 75 mcg by mouth daily before breakfast.    . NAMENDA XR 28 MG CP24 TAKE ONE CAPSULE BY MOUTH ONCE DAILY 90 capsule 0  . polyethylene glycol (MIRALAX / GLYCOLAX) packet Take 17 g by mouth daily as  needed for mild constipation. 14 each 0  . vitamin B-12 (CYANOCOBALAMIN) 1000 MCG tablet Take 1,000 mcg by mouth daily.     No facility-administered medications prior to visit.    PAST MEDICAL HISTORY: Past Medical History  Diagnosis Date  . Dementia   . Dyslipidemia   . Vitamin D deficiency   . Alzheimer's disease 06/16/2012  . Ankle fracture, left     As a child  . Hiatal hernia   . History of pulmonary embolism     IVC filter placed  . Abnormality of gait     PAST SURGICAL HISTORY: Past Surgical History  Procedure Laterality Date  . Ankle fracture surgery Left   . Hiatal hernia repair N/A   . Cataract extraction Left   . Hip arthroplasty Right 07/16/2013    Procedure: RIGHT HEMIARTHROPLASTY HIP;  Surgeon: Shelda Pal, MD;  Location: [facility] OR;  Service: Orthopedics;  Laterality: Right;    FAMILY HISTORY: Family History  Problem Relation Age of Onset  . Heart disease Mother   . Cancer Father   . Heart disease Sister     SOCIAL HISTORY: History   Social History  . Marital Status: Widowed    Spouse Name: N/A    Number of Children: 4  . Years of Education: HS   Occupational History  . RETIRED    Social History Main Topics  . Smoking status: Never Smoker   . Smokeless tobacco: Never Used  . Alcohol Use: No  . Drug Use: No  . Sexual Activity: Not on file   Other Topics Concern  . Not on file   Social History Narrative      PHYSICAL EXAM  Filed Vitals:   04/03/14 1327  BP: 95/62  Pulse: 75  Height:  (1.549 m)  Weight: 98 lb (44.453 kg)   Body mass index is 18.53 kg/(m^2).  Generalized: Well developed, in no acute distress   Neurological examination  Mentation: Alert. Follows commands intermittently. speech and language fluent. Unable to complete the MMSE. Cranial nerve II-XII:Extraocular movements were full, visual field were full on confrontational test. Facial sensation and strength were normal. Uvula tongue midline. Head turning and  shoulder shrug  were normal and symmetric. Motor: The motor testing reveals 5 over 5 strength of all 4 extremities. Good symmetric motor tone is noted throughout.  Sensory: Sensory testing is intact to soft touch on all 4 extremities. No evidence of extinction is noted.  Coordination: difficulty with Cerebellar testing reveals good finger-nose-finger and heel-to-shin bilaterally  due to comprehension of directions. Gait and station: patient in a wheelchair.     DIAGNOSTIC DATA (LABS, IMAGING, TESTING) - I reviewed patient records, labs, notes, testing and imaging myself where available.  Lab Results  Component Value Date   WBC 7.2 08/14/2013   HGB 12.4 08/14/2013   HCT 38.6 08/14/2013   MCV 98.5 08/14/2013   PLT 260 08/14/2013      Component Value Date/Time   NA 139 08/14/2013 2040   K 3.9 08/14/2013 2040   CL  100 08/14/2013 2040   CO2 28 08/14/2013 2040   GLUCOSE 95 08/14/2013 2040   BUN 9 08/14/2013 2040   CREATININE 0.84 08/14/2013 2040   CALCIUM 9.3 08/14/2013 2040   PROT 5.3* 07/19/2013 0449   ALBUMIN 1.9* 07/19/2013 0449   AST 28 07/19/2013 0449   ALT 13 07/19/2013 0449   ALKPHOS 101 07/19/2013 0449   BILITOT 0.5 07/19/2013 0449   GFRNONAA 60* 08/14/2013 2040   GFRAA 70* 08/14/2013 2040    Lab Results  Component Value Date   HGBA1C 5.7* 07/12/2013   ASSESSMENT AND PLAN 81 y.o. year old female  has a past medical history of Dementia; Dyslipidemia; Vitamin D deficiency; Alzheimer's disease (06/16/2012); Ankle fracture, left; Hiatal hernia; History of pulmonary embolism; and Abnormality of gait. here with:  1. Alzheimer's disease 2. Abnormality of gait and mobility  The patient was unable to complete a Mini-Mental Status exam. She is currently taking Aricept and Namenda. I discussed in detail with the daughter that with her current status Aricept and Namenda may no longer be beneficial. Daughter verbalized understanding. We will discontinue the Namenda at this visit  and will consider stopping Aricept at the next visit. The patient is having trouble sleeping at night. The daughter has tried  nonpharmacologic treatment and it has not been beneficial. I will start the patient on 25 mg of trazodone to take at bedtime. I have reviewed the side effects of trazodone with the daughter. I have also provided her with a printout about the medication trazodone and contraindications as well as side effects.the daughter feels that a hospital bed would be beneficial for the patient. I have given her a prescription for this. If the patient's symptoms worsen or she develops new symptoms they should let us know. Otherwise she will follow up in 3 months or sooner if needed.  Butch Penny, MSN, NP-C 04/03/2014, 1:29 PM [facility] [address] [place], [place] [address] [phone]  Note: This document was prepared with digital dictation and possible smart phrase technology. Any transcriptional errors that result from this process are unintentional.

## 2014-04-03 NOTE — Patient Instructions (Signed)
Stop Namenda, I will consider stopping Aricept at the next visit Start Trazodone 25 mg at bedtime for sleep.   Trazodone tablets What is this medicine? TRAZODONE (TRAZ oh done) is used to treat depression. This medicine may be used for other purposes; ask your health care provider or pharmacist if you have questions. COMMON BRAND NAME(S): Desyrel What should I tell my health care provider before I take this medicine? They need to know if you have any of these conditions: -attempted suicide or thinking about it -bipolar disorder -bleeding problems -glaucoma -heart disease, or previous heart attack -irregular heart beat -kidney or liver disease -low levels of sodium in the blood -an unusual or allergic reaction to trazodone, other medicines, foods, dyes or preservatives -pregnant or trying to get pregnant -breast-feeding How should I use this medicine? Take this medicine by mouth with a glass of water. Follow the directions on the prescription label. Take this medicine shortly after a meal or a light snack. Take your medicine at regular intervals. Do not take your medicine more often than directed. Do not stop taking this medicine suddenly except upon the advice of your doctor. Stopping this medicine too quickly may cause serious side effects or your condition may worsen. A special MedGuide will be given to you by the pharmacist with each prescription and refill. Be sure to read this information carefully each time. Talk to your pediatrician regarding the use of this medicine in children. Special care may be needed. Overdosage: If you think you have taken too much of this medicine contact a poison control center or emergency room at once. NOTE: This medicine is only for you. Do not share this medicine with others. What if I miss a dose? If you miss a dose, take it as soon as you can. If it is almost time for your next dose, take only that dose. Do not take double or extra doses. What may  interact with this medicine? Do not take this medicine with any of the following medications: -certain medicines for fungal infections like fluconazole, itraconazole, ketoconazole, posaconazole, voriconazole -cisapride -dofetilide -dronedarone -linezolid -MAOIs like Carbex, Eldepryl, Marplan, Nardil, and Parnate -mesoridazine -methylene blue (injected into a vein) -pimozide -saquinavir -thioridazine -ziprasidone This medicine may also interact with the following medications: -alcohol -antiviral medicines for HIV or AIDS -aspirin and aspirin-like medicines -barbiturates like phenobarbital -certain medicines for blood pressure, heart disease, irregular heart beat -certain medicines for depression, anxiety, or psychotic disturbances -certain medicines for migraine headache like almotriptan, eletriptan, frovatriptan, naratriptan, rizatriptan, sumatriptan, zolmitriptan -certain medicines for seizures like carbamazepine and phenytoin -certain medicines for sleep -certain medicines that treat or prevent blood clots like dalteparin, enoxaparin, warfarin -digoxin -fentanyl -lithium -NSAIDS, medicines for pain and inflammation, like ibuprofen or naproxen -other medicines that prolong the QT interval (cause an abnormal heart rhythm) -rasagiline -supplements like St. John's wort, kava kava, valerian -tramadol -tryptophan This list may not describe all possible interactions. Give your health care provider a list of all the medicines, herbs, non-prescription drugs, or dietary supplements you use. Also tell them if you smoke, drink alcohol, or use illegal drugs. Some items may interact with your medicine. What should I watch for while using this medicine? Tell your doctor if your symptoms do not get better or if they get worse. Visit your doctor or health care professional for regular checks on your progress. Because it may take several weeks to see the full effects of this medicine, it is  important to continue your treatment as  prescribed by your doctor. Patients and their families should watch out for new or worsening thoughts of suicide or depression. Also watch out for sudden changes in feelings such as feeling anxious, agitated, panicky, irritable, hostile, aggressive, impulsive, severely restless, overly excited and hyperactive, or not being able to sleep. If this happens, especially at the beginning of treatment or after a change in dose, call your health care professional. Kathy [name] may get drowsy or dizzy. Do not drive, use machinery, or do anything that needs mental alertness until you know how this medicine affects you. Do not stand or sit up quickly, especially if you are an older patient. This reduces the risk of dizzy or fainting spells. Alcohol may interfere with the effect of this medicine. Avoid alcoholic drinks. This medicine may cause dry eyes and blurred vision. If you wear contact lenses you may feel some discomfort. Lubricating drops may help. See your eye doctor if the problem does not go away or is severe. Your mouth may get dry. Chewing sugarless gum, sucking hard candy and drinking plenty of water may help. Contact your doctor if the problem does not go away or is severe. What side effects may I notice from receiving this medicine? Side effects that you should report to your doctor or health care professional as soon as possible: -allergic reactions like skin rash, itching or hives, swelling of the face, lips, or tongue -fast, irregular heartbeat -feeling faint or lightheaded, falls -painful erections or other sexual dysfunction -suicidal thoughts or other mood changes -trembling Side effects that usually do not require medical attention (report to your doctor or health care professional if they continue or are bothersome): -constipation -headache -muscle aches or pains -nausea, vomiting -unusually weak or tired This list may not describe all possible side  effects. Call your doctor for medical advice about side effects. You may report side effects to FDA at 1-800-FDA-1088. Where should I keep my medicine? Keep out of the reach of children. Store at room temperature between 15 and 30 degrees C (59 to 86 degrees F). Protect from light. Keep container tightly closed. Throw away any unused medicine after the expiration date. NOTE: This sheet is a summary. It may not cover all possible information. If you have questions about this medicine, talk to your doctor, pharmacist, or health care provider.  2015, Elsevier/Gold Standard. (2012-10-02 15:46:28)

## 2014-04-29 ENCOUNTER — Other Ambulatory Visit: Payer: Self-pay | Admitting: Neurology

## 2014-05-08 ENCOUNTER — Telehealth: Payer: Self-pay | Admitting: Neurology

## 2014-05-08 NOTE — Telephone Encounter (Signed)
[name] is calling stating she needs to speak with Dr. Anne HahnWillis about putting her the pt back on Namenda.  She states that Kathy [name] took her off.  Please call and advise.

## 2014-05-08 NOTE — Telephone Encounter (Signed)
I called to speak with the daughter, left a message, I will call back later.

## 2014-05-14 ENCOUNTER — Emergency Department (HOSPITAL_COMMUNITY): Payer: Medicare HMO

## 2014-05-14 ENCOUNTER — Telehealth: Payer: Self-pay | Admitting: Neurology

## 2014-05-14 ENCOUNTER — Telehealth: Payer: Self-pay | Admitting: Adult Health

## 2014-05-14 ENCOUNTER — Emergency Department (HOSPITAL_COMMUNITY)
Admission: EM | Admit: 2014-05-14 | Discharge: 2014-05-14 | Disposition: A | Discharge status: Home / Self Care | Payer: Medicare HMO | Attending: Emergency Medicine | Admitting: Emergency Medicine

## 2014-05-14 ENCOUNTER — Encounter (HOSPITAL_COMMUNITY): Payer: Self-pay | Admitting: Neurology

## 2014-05-14 DIAGNOSIS — R627 Adult failure to thrive: Secondary | ICD-10-CM | POA: Diagnosis present

## 2014-05-14 DIAGNOSIS — Z86711 Personal history of pulmonary embolism: Secondary | ICD-10-CM | POA: Insufficient documentation

## 2014-05-14 DIAGNOSIS — G309 Alzheimer's disease, unspecified: Secondary | ICD-10-CM | POA: Diagnosis not present

## 2014-05-14 DIAGNOSIS — F0280 Dementia in other diseases classified elsewhere without behavioral disturbance: Secondary | ICD-10-CM

## 2014-05-14 DIAGNOSIS — Z8781 Personal history of (healed) traumatic fracture: Secondary | ICD-10-CM | POA: Insufficient documentation

## 2014-05-14 DIAGNOSIS — Z8719 Personal history of other diseases of the digestive system: Secondary | ICD-10-CM | POA: Diagnosis not present

## 2014-05-14 DIAGNOSIS — R269 Unspecified abnormalities of gait and mobility: Secondary | ICD-10-CM

## 2014-05-14 DIAGNOSIS — Z7982 Long term (current) use of aspirin: Secondary | ICD-10-CM | POA: Diagnosis not present

## 2014-05-14 DIAGNOSIS — Z79899 Other long term (current) drug therapy: Secondary | ICD-10-CM | POA: Insufficient documentation

## 2014-05-14 DIAGNOSIS — E860 Dehydration: Secondary | ICD-10-CM | POA: Insufficient documentation

## 2014-05-14 LAB — BASIC METABOLIC PANEL
Anion gap: 9 (ref 5–15)
BUN: 5 mg/dL — AB (ref 6–23)
CALCIUM: 9 mg/dL (ref 8.4–10.5)
CO2: 31 mmol/L (ref 19–32)
Chloride: 100 mmol/L (ref 96–112)
Creatinine, Ser: 0.79 mg/dL (ref 0.50–1.10)
GFR calc Af Amer: 83 mL/min — ABNORMAL LOW (ref >90)
GFR, EST NON AFRICAN AMERICAN: 72 mL/min — AB (ref >90)
Glucose, Bld: 113 mg/dL — ABNORMAL HIGH (ref 70–99)
POTASSIUM: 4 mmol/L (ref 3.5–5.1)
Sodium: 140 mmol/L (ref 135–145)

## 2014-05-14 LAB — URINALYSIS, ROUTINE W REFLEX MICROSCOPIC
BILIRUBIN URINE: NEGATIVE
Glucose, UA: NEGATIVE mg/dL
Ketones, ur: NEGATIVE mg/dL
Leukocytes, UA: NEGATIVE
NITRITE: NEGATIVE
Protein, ur: NEGATIVE mg/dL
SPECIFIC GRAVITY, URINE: 1.006 (ref 1.005–1.030)
Urobilinogen, UA: 0.2 mg/dL (ref 0.0–1.0)
pH: 7 (ref 5.0–8.0)

## 2014-05-14 LAB — URINE MICROSCOPIC-ADD ON

## 2014-05-14 LAB — CBC
HCT: 41.3 % (ref 36.0–46.0)
Hemoglobin: 13.1 g/dL (ref 12.0–15.0)
MCH: 31.5 pg (ref 26.0–34.0)
MCHC: 31.7 g/dL (ref 30.0–36.0)
MCV: 99.3 fL (ref 78.0–100.0)
Platelets: 273 10*3/uL (ref 150–400)
RBC: 4.16 MIL/uL (ref 3.87–5.11)
RDW: 14.7 % (ref 11.5–15.5)
WBC: 9.8 10*3/uL (ref 4.0–10.5)

## 2014-05-14 LAB — I-STAT TROPONIN, ED: TROPONIN I, POC: 0.01 ng/mL (ref 0.00–0.08)

## 2014-05-14 MED ORDER — SODIUM CHLORIDE 0.9 % IV BOLUS (SEPSIS)
1000.0000 mL | Freq: Once | INTRAVENOUS | Status: AC
  Administered 2014-05-14: 1000 mL via INTRAVENOUS

## 2014-05-14 MED ORDER — ONDANSETRON 8 MG PO TBDP: 8.0000 mg | ORAL_TABLET | Freq: Three times a day (TID) | ORAL | Status: AC | PRN

## 2014-05-14 MED ORDER — MORPHINE SULFATE 2 MG/ML IJ SOLN
2.0000 mg | Freq: Once | INTRAMUSCULAR | Status: AC
  Administered 2014-05-14: 2 mg via INTRAVENOUS
  Filled 2014-05-14: qty 1

## 2014-05-14 NOTE — Telephone Encounter (Signed)
I called the daughter. The patient has had a significant downturn with her dementia. She is no longer taking much in the way of food and fluids. She is not ambulatory at this point. I will make referral to hospice.

## 2014-05-14 NOTE — Telephone Encounter (Signed)
Order Faxed to [phone].

## 2014-05-14 NOTE — Telephone Encounter (Signed)
New Prescription printed and given to CMA to fax.

## 2014-05-14 NOTE — Telephone Encounter (Signed)
[name] is calling stating she needs to speak with Dr. Anne HahnWillis about possibly getting hospice for her pt. She states she is in her last stages.  She just has some questions also regarding her care. She will be with the pt today at her house. Please call and advise.

## 2014-05-14 NOTE — Discharge Instructions (Signed)
Dehydration, Adult Dehydration is when you lose more fluids from the body than you take in. Vital organs like the kidneys, brain, and heart cannot function without a proper amount of fluids and salt. Any loss of fluids from the body can cause dehydration.  CAUSES   Vomiting.  Diarrhea.  Excessive sweating.  Excessive urine output.  Fever. SYMPTOMS  Mild dehydration  Thirst.  Dry lips.  Slightly dry mouth. Moderate dehydration  Very dry mouth.  Sunken eyes.  Skin does not bounce back quickly when lightly pinched and released.  Dark urine and decreased urine production.  Decreased tear production.  Headache. Severe dehydration  Very dry mouth.  Extreme thirst.  Rapid, weak pulse (more than 100 beats per minute at rest).  Cold hands and feet.  Not able to sweat in spite of heat and temperature.  Rapid breathing.  Blue lips.  Confusion and lethargy.  Difficulty being awakened.  Minimal urine production.  No tears. DIAGNOSIS  Your caregiver will diagnose dehydration based on your symptoms and your exam. Blood and urine tests will help confirm the diagnosis. The diagnostic evaluation should also identify the cause of dehydration. TREATMENT  Treatment of mild or moderate dehydration can often be done at home by increasing the amount of fluids that you drink. It is best to drink small amounts of fluid more often. Drinking too much at one time can make vomiting worse. Refer to the home care instructions below. Severe dehydration needs to be treated at the hospital where you will probably be given intravenous (IV) fluids that contain water and electrolytes. HOME CARE INSTRUCTIONS   Ask your caregiver about specific rehydration instructions.  Drink enough fluids to keep your urine clear or pale yellow.  Drink small amounts frequently if you have nausea and vomiting.  Eat as you normally do.  Avoid:  Foods or drinks high in sugar.  Carbonated  drinks.  Juice.  Extremely hot or cold fluids.  Drinks with caffeine.  Fatty, greasy foods.  Alcohol.  Tobacco.  Overeating.  Gelatin desserts.  Wash your hands well to avoid spreading bacteria and viruses.  Only take over-the-counter or prescription medicines for pain, discomfort, or fever as directed by your caregiver.  Ask your caregiver if you should continue all prescribed and over-the-counter medicines.  Keep all follow-up appointments with your caregiver. SEEK MEDICAL CARE IF:  You have abdominal pain and it increases or stays in one area (localizes).  You have a rash, stiff neck, or severe headache.  You are irritable, sleepy, or difficult to awaken.  You are weak, dizzy, or extremely thirsty. SEEK IMMEDIATE MEDICAL CARE IF:   You are unable to keep fluids down or you get worse despite treatment.  You have frequent episodes of vomiting or diarrhea.  You have blood or green matter (bile) in your vomit.  You have blood in your stool or your stool looks black and tarry.  You have not urinated in 6 to 8 hours, or you have only urinated a small amount of very dark urine.  You have a fever.  You faint. MAKE SURE YOU:   Understand these instructions.  Will watch your condition.  Will get help right away if you are not doing well or get worse. Document Released: 03/01/2005 Document Revised: 05/24/2011 Document Reviewed: 10/19/2010 ExitCare Patient Information 2015 [employer]. This information is not intended to replace advice given to you by your health care provider. Make sure you discuss any questions you have with your health care   provider.  

## 2014-05-14 NOTE — ED Notes (Signed)
Pt has alzheimer's disease and is in the last stages per family member. Reports today pt c/o left jaw pain, bilateral shoulder and neck pain. This is different. Pt denies any pain at current. Pt is resting comfortably in chair. Skin warm and dry. Family member reports pt isn't eating or drinking anymore.

## 2014-05-14 NOTE — Telephone Encounter (Signed)
Patient's daughter, Roderic PalauKay Simpson stated [facility] order [facility] bed faxed to [phone].  Please call and advise.

## 2014-05-15 LAB — URINE CULTURE
CULTURE: NO GROWTH
Colony Count: NO GROWTH

## 2014-05-15 NOTE — ED Provider Notes (Signed)
CSN: [id number]     Arrival date & time 05/14/14  1640 History   First MD Initiated Contact with Patient 05/14/14 1651     Chief Complaint  Patient presents with  . Failure To Thrive    Level V caveat: Dementia  HPI Patient with advanced dementia seemed to be motioning to her family that she was having pain in her left jaw and left neck.  They state this is atypical for her.  She did have diarrhea yesterday multiple times.  No blood was noted.  The reported decreased oral intake over the past 24-48 hours.  He feels though the color of her urine is dark but it is without odor.  They report no vomiting.  No documented fevers at home.  Patient is unable to provide any history at this time.  Patient is unable to provide any reasonable history.  History is obtained by family members   Past Medical History  Diagnosis Date  . Dementia   . Dyslipidemia   . Vitamin D deficiency   . Alzheimer's disease 06/16/2012  . Ankle fracture, left     As a child  . Hiatal hernia   . History of pulmonary embolism     IVC filter placed  . Abnormality of gait    Past Surgical History  Procedure Laterality Date  . Ankle fracture surgery Left   . Hiatal hernia repair N/A   . Cataract extraction Left   . Hip arthroplasty Right 07/16/2013    Procedure: RIGHT HEMIARTHROPLASTY HIP;  Surgeon: Shelda Pal, MD;  Location: [facility] OR;  Service: Orthopedics;  Laterality: Right;   Family History  Problem Relation Age of Onset  . Heart disease Mother   . Cancer Father   . Heart disease Sister    History  Substance Use Topics  . Smoking status: Never Smoker   . Smokeless tobacco: Never Used  . Alcohol Use: No   OB History    No data available     Review of Systems  Unable to perform ROS     Allergies  Codeine  Home Medications   Prior to Admission medications   Medication Sig Start Date End Date Taking? Authorizing Provider  aspirin EC 81 MG tablet Take 81 mg by mouth daily.   Yes Historical  Provider, MD  donepezil (ARICEPT) 5 MG tablet TAKE ONE TABLET BY MOUTH AT BEDTIME 04/29/14  Yes York Spaniel, MD  levothyroxine (SYNTHROID, LEVOTHROID) 75 MCG tablet Take 75 mcg by mouth daily before breakfast.   Yes Historical Provider, MD  potassium chloride SA (K-DUR,KLOR-CON) 20 MEQ tablet Take 10 mEq by mouth daily.   Yes Historical Provider, MD  traZODone (DESYREL) 50 MG tablet Take 0.5 tablets (25 mg total) by mouth at bedtime. 04/03/14  Yes Butch Penny, NP  vitamin B-12 (CYANOCOBALAMIN) 1000 MCG tablet Take 1,000 mcg by mouth daily.   Yes Historical Provider, MD  acetaminophen (TYLENOL) 325 MG tablet Take 2 tablets (650 mg total) by mouth every 6 (six) hours as needed for mild pain or moderate pain. Patient not taking: Reported on 05/14/2014 07/17/13   Shelda Pal, MD  ferrous sulfate 325 (65 FE) MG tablet Take 1 tablet (325 mg total) by mouth 2 (two) times daily with a meal. Patient not taking: Reported on 05/14/2014 07/19/13   Renae Fickle, MD         polyethylene glycol [facility] Hal) packet Take 17 g by mouth daily as needed for mild constipation. Patient  not taking: Reported on 05/14/2014 07/19/13   Renae FickleMackenzie Short, MD   BP 118/63 mmHg  Pulse 93  Temp(Src) 99.1 F (37.3 C) (Axillary)  Resp 16  SpO2 95% Physical Exam  Constitutional: She appears well-developed and well-nourished. No distress.  HENT:  Head: Normocephalic and atraumatic.  Dry mucous membranes  Eyes: EOM are normal.  Neck: Normal range of motion.  Cardiovascular: Normal rate, regular rhythm and normal heart sounds.   Pulmonary/Chest: Effort normal and breath sounds normal.  Abdominal: Soft. She exhibits no distension. There is no tenderness.  Musculoskeletal: Normal range of motion.  Neurological: She is alert.  Moves all extremities equally  Skin: Skin is warm and dry.  Psychiatric: She has a normal mood and affect. Judgment normal.  Nursing note and vitals reviewed.   ED Course  Procedures  (including critical care time) Labs Review Labs Reviewed  BASIC METABOLIC PANEL - Abnormal; Notable for the following:    Glucose, Bld 113 (*)    BUN 5 (*)    GFR calc non Af Amer 72 (*)    GFR calc Af Amer 83 (*)    All other components within normal limits  URINALYSIS, ROUTINE W REFLEX MICROSCOPIC - Abnormal; Notable for the following:    Hgb urine dipstick TRACE (*)    All other components within normal limits  URINE MICROSCOPIC-ADD ON - Abnormal; Notable for the following:    Squamous Epithelial / LPF FEW (*)    All other components within normal limits  URINE CULTURE  CBC  I-STAT TROPOININ, ED    Imaging Review Dg Chest 2 View  05/14/2014   CLINICAL DATA:  Cough for 2 days  EXAM: CHEST  2 VIEW  COMPARISON:  August 14, 2013  FINDINGS: There is no edema or consolidation. Heart is upper normal in size with pulmonary vascularity within normal limits. There is atherosclerotic change in the aorta. No adenopathy. There is anterior wedging of several mid and lower thoracic vertebral bodies, stable.  IMPRESSION: No edema or consolidation.   Electronically Signed   By: Bretta BangWilliam  Woodruff III M.D.   On: 05/14/2014 18:32     EKG Interpretation   Date/Time:  Tuesday May 14 2014 16:45:31 EST Ventricular Rate:  93 PR Interval:  184 QRS Duration: 128 QT Interval:  394 QTC Calculation: 489 R Axis:   -91 Text Interpretation:  Normal sinus rhythm Right bundle branch block  Abnormal ECG No significant change was found Confirmed by Deondra Wigger  MD,  Caryn BeeKEVIN ([id number]) on 05/14/2014 5:22:54 PM      MDM   Final diagnoses:  Dehydration    Patient looks as though she feels much better after IV fluids.  Family agrees that her color is better and that she seems happier.  I suspect some dehydration.  Doubt ACS.  EKG is without acute ischemic changes.  Troponin is normal.  Urine is without signs of infection.  Labs are without significant abnormality    Lyanne CoKevin M Manhattan Mccuen, MD 05/15/14 [phone]

## 2014-05-16 ENCOUNTER — Telehealth: Payer: Self-pay | Admitting: Neurology

## 2014-05-16 NOTE — Telephone Encounter (Signed)
WID: can you give a verbal order for DNR for patient now under Hospice care.  Thank you.

## 2014-05-16 NOTE — Telephone Encounter (Signed)
Gave verbal to Lonie PeakJudith Alexander for DNR. There has been a rapid decline and family requests DNR.

## 2014-05-16 NOTE — Telephone Encounter (Signed)
Daughter is also calling stating that Hospice has accepted the patient and they will be taking care of the patient from here on out.

## 2014-05-16 NOTE — Telephone Encounter (Signed)
Events noted, agree with DNR status.

## 2014-05-16 NOTE — Telephone Encounter (Signed)
[name] is calling stating they need a verbal order for DNR for pt and could their doctor sign the form for Dr. Anne HahnWillis so they can get the form quicker since he is not in the office today.  Please call and advise.

## 2014-05-20 ENCOUNTER — Telehealth: Payer: Self-pay | Admitting: Neurology

## 2014-05-20 MED ORDER — MORPHINE SULFATE (CONCENTRATE) 10 MG /0.5 ML PO SOLN: 10.0000 mg | ORAL | Status: AC | PRN

## 2014-05-20 MED ORDER — LORAZEPAM 2 MG/ML PO CONC: 0.5000 mg | Freq: Four times a day (QID) | ORAL | Status: AC | PRN

## 2014-05-20 NOTE — Telephone Encounter (Signed)
Patient's daughter, Jocelyn LamerKay Simpon @ [phone], stated Hospice Rn questioning if they should discontinue Rx donepezil (ARICEPT) 5 MG tablet.  Also questioning if patient should continue taking Rx's levothyroxine (SYNTHROID, LEVOTHROID) 75 MCG tablet,  potassium chloride SA (K-DUR,KLOR-CON) 20 MEQ tablet, and traZODone (DESYREL) 50 MG tablet.  Hospice wants to add Rx's ATIVAN for restlessness and LIQUID MORPHINE for pain due to patient's dehydratrion.  Please call and advise.

## 2014-05-20 NOTE — Telephone Encounter (Signed)
I called the daughter. The patient is now on hospice. Okay to stop the donepezil. The trazodone seems to help at night, can continue this medication as well as the Synthroid and potassium. The patient will be given a prescription for Ativan and morphine as per hospice recommendations.

## 2014-05-21 ENCOUNTER — Telehealth: Payer: Self-pay

## 2014-05-21 NOTE — Telephone Encounter (Signed)
Called patient to inform Rx ready for pick up at front desk. Daughter Roderic PalauKay Simpson said patient's hospice nurse will be picking up med for patient. Her name is Market researcheratty Beard.

## 2014-05-22 NOTE — Telephone Encounter (Signed)
I called the hospice center. Okay for them to write the prescriptions for Ativan and morphine. They will take over this service.

## 2014-05-22 NOTE — Telephone Encounter (Signed)
Kathy HainesPatty Beard with [facility] @ [phone], received messages regarding pick up medication per daughter, RN not able to pick up medication.  Patty's requesting verbal orders to have Hospice MD write Rx.  Please call and advise.

## 2014-06-14 DEATH — deceased

## 2014-08-05 ENCOUNTER — Ambulatory Visit: Payer: Medicare HMO | Admitting: Adult Health

## 2014-09-10 ENCOUNTER — Ambulatory Visit: Payer: Medicare HMO | Admitting: Adult Health

## 2014-10-05 IMAGING — CR DG PORTABLE PELVIS
1 series · 1 of 1 positions shown · non-contrast
Comparison: 07/11/2013

CLINICAL DATA: Postop hip replacement.

EXAM:
PORTABLE PELVIS 1-2 VIEWS

[AP]
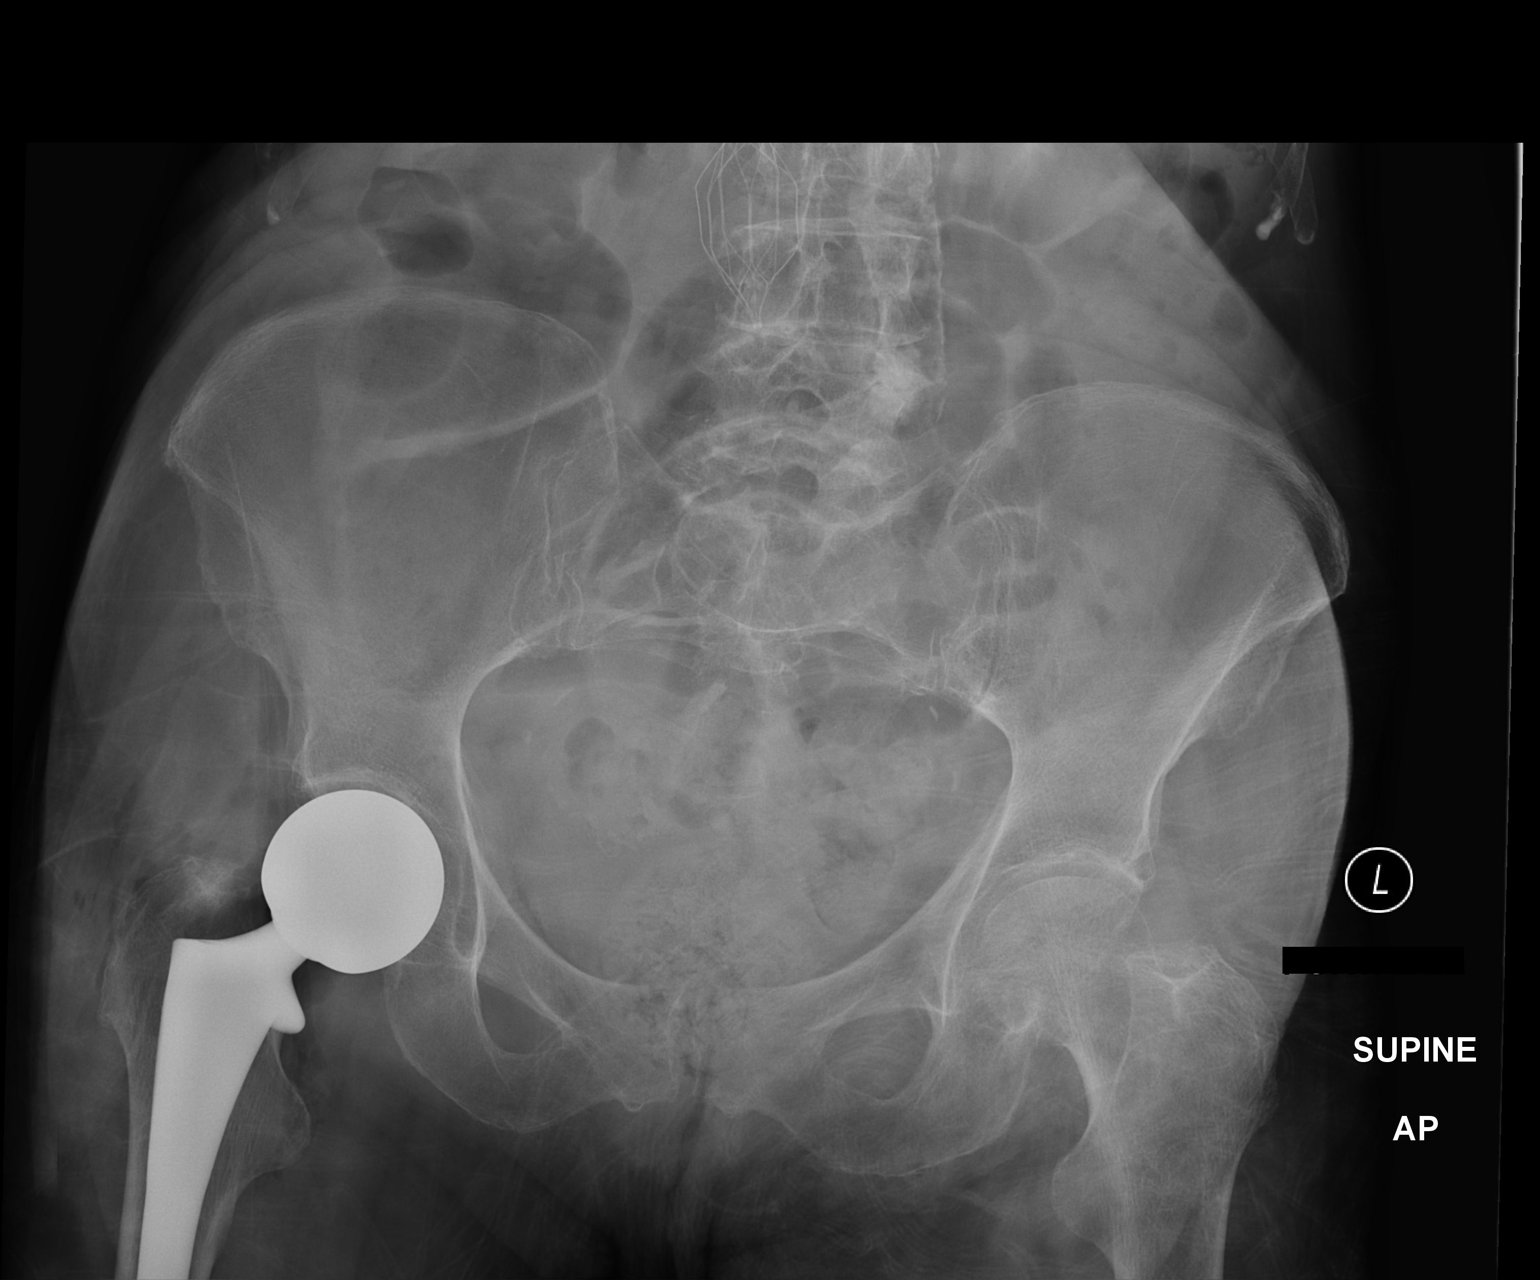

[1 of 1 positions shown; findings below may reference images not displayed]

FINDINGS: Imaged portions of a bipolar right hip hemiarthroplasty are
unremarkable. Expected postoperative gas around the right hip.
Negative for pelvic ring or left hip fracture. IVC filter.
IMPRESSION: Imaged portions of recent right hip hemiarthroplasty are
unremarkable.

## 2015-08-03 IMAGING — DX DG CHEST 2V
1 series · 1 of 1 positions shown · non-contrast
Comparison: August 14, 2013

CLINICAL DATA: Cough for 2 days

EXAM:
CHEST  2 VIEW

[chest ap]
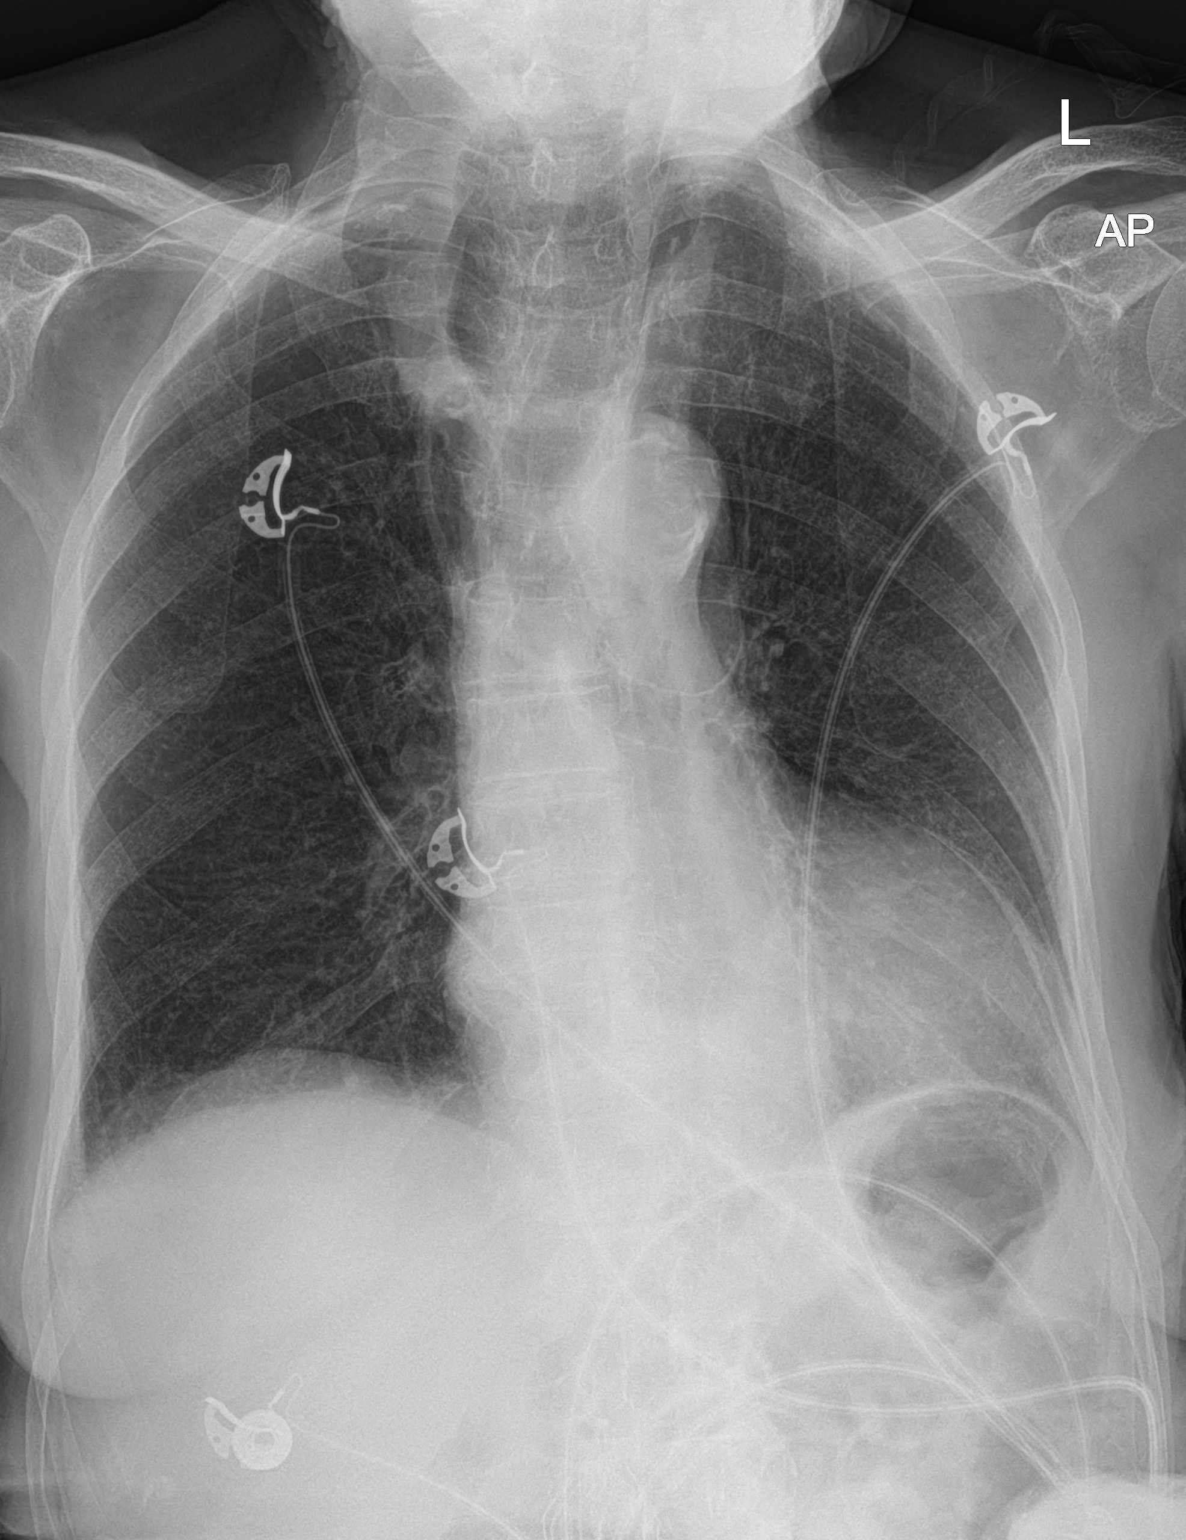

[1 of 1 positions shown; findings below may reference images not displayed]

FINDINGS: There is no edema or consolidation. Heart is upper normal in size
with pulmonary vascularity within normal limits. There is
atherosclerotic change in the aorta. No adenopathy. There is
anterior wedging of several mid and lower thoracic vertebral bodies,
stable.
IMPRESSION: No edema or consolidation.

## 2016-12-27 NOTE — Telephone Encounter (Signed)
Closing Encounter.
